# Patient Record
Sex: Male | Born: 1960 | Race: White | Hispanic: No | Smoking: Current every day smoker
Health system: Southern US, Community
[De-identification: ages and names within clinical notes are randomized; demographics above are authoritative.]

## PROBLEM LIST (undated history)

## (undated) DIAGNOSIS — F1010 Alcohol abuse, uncomplicated: Secondary | ICD-10-CM

## (undated) DIAGNOSIS — B182 Chronic viral hepatitis C: Secondary | ICD-10-CM

## (undated) DIAGNOSIS — I2510 Atherosclerotic heart disease of native coronary artery without angina pectoris: Secondary | ICD-10-CM

## (undated) DIAGNOSIS — J4489 Other specified chronic obstructive pulmonary disease: Secondary | ICD-10-CM

## (undated) DIAGNOSIS — Z91199 Patient's noncompliance with other medical treatment and regimen due to unspecified reason: Secondary | ICD-10-CM

## (undated) DIAGNOSIS — I498 Other specified cardiac arrhythmias: Secondary | ICD-10-CM

## (undated) DIAGNOSIS — Z9119 Patient's noncompliance with other medical treatment and regimen: Secondary | ICD-10-CM

## (undated) DIAGNOSIS — E785 Hyperlipidemia, unspecified: Secondary | ICD-10-CM

## (undated) DIAGNOSIS — M549 Dorsalgia, unspecified: Secondary | ICD-10-CM

## (undated) DIAGNOSIS — E119 Type 2 diabetes mellitus without complications: Secondary | ICD-10-CM

## (undated) DIAGNOSIS — M25559 Pain in unspecified hip: Secondary | ICD-10-CM

## (undated) DIAGNOSIS — E669 Obesity, unspecified: Secondary | ICD-10-CM

## (undated) DIAGNOSIS — R52 Pain, unspecified: Secondary | ICD-10-CM

## (undated) DIAGNOSIS — F1910 Other psychoactive substance abuse, uncomplicated: Secondary | ICD-10-CM

## (undated) DIAGNOSIS — I4892 Unspecified atrial flutter: Secondary | ICD-10-CM

## (undated) DIAGNOSIS — D45 Polycythemia vera: Secondary | ICD-10-CM

## (undated) DIAGNOSIS — J449 Chronic obstructive pulmonary disease, unspecified: Secondary | ICD-10-CM

## (undated) DIAGNOSIS — G8929 Other chronic pain: Secondary | ICD-10-CM

## (undated) DIAGNOSIS — M25569 Pain in unspecified knee: Secondary | ICD-10-CM

## (undated) DIAGNOSIS — F17200 Nicotine dependence, unspecified, uncomplicated: Secondary | ICD-10-CM

## (undated) HISTORY — DX: Polycythemia vera: D45

## (undated) HISTORY — DX: Patient's noncompliance with other medical treatment and regimen due to unspecified reason: Z91.199

## (undated) HISTORY — DX: Nicotine dependence, unspecified, uncomplicated: F17.200

## (undated) HISTORY — DX: Other specified cardiac arrhythmias: I49.8

## (undated) HISTORY — DX: Atherosclerotic heart disease of native coronary artery without angina pectoris: I25.10

## (undated) HISTORY — DX: Other specified chronic obstructive pulmonary disease: J44.89

## (undated) HISTORY — DX: Chronic viral hepatitis C: B18.2

## (undated) HISTORY — DX: Chronic obstructive pulmonary disease, unspecified: J44.9

## (undated) HISTORY — DX: Hyperlipidemia, unspecified: E78.5

## (undated) HISTORY — DX: Alcohol abuse, uncomplicated: F10.10

## (undated) HISTORY — DX: Obesity, unspecified: E66.9

## (undated) HISTORY — DX: Unspecified atrial flutter: I48.92

## (undated) HISTORY — DX: Patient's noncompliance with other medical treatment and regimen: Z91.19

## (undated) HISTORY — DX: Type 2 diabetes mellitus without complications: E11.9

---

## 2000-03-25 DIAGNOSIS — I2510 Atherosclerotic heart disease of native coronary artery without angina pectoris: Secondary | ICD-10-CM

## 2000-03-25 HISTORY — DX: Atherosclerotic heart disease of native coronary artery without angina pectoris: I25.10

## 2000-10-27 ENCOUNTER — Inpatient Hospital Stay (HOSPITAL_COMMUNITY): Admission: EM | Admit: 2000-10-27 | Discharge: 2000-10-28 | Payer: Self-pay | Admitting: Cardiology

## 2004-03-21 ENCOUNTER — Emergency Department (HOSPITAL_COMMUNITY): Admission: EM | Admit: 2004-03-21 | Discharge: 2004-03-21 | Payer: Self-pay | Admitting: *Deleted

## 2005-03-25 HISTORY — PX: OTHER SURGICAL HISTORY: SHX169

## 2005-06-21 ENCOUNTER — Ambulatory Visit (HOSPITAL_COMMUNITY): Admission: RE | Admit: 2005-06-21 | Discharge: 2005-06-21 | Payer: Self-pay | Admitting: Family Medicine

## 2005-06-21 ENCOUNTER — Emergency Department (HOSPITAL_COMMUNITY): Admission: EM | Admit: 2005-06-21 | Discharge: 2005-06-21 | Payer: Self-pay | Admitting: Family Medicine

## 2005-08-02 ENCOUNTER — Encounter: Admission: RE | Admit: 2005-08-02 | Discharge: 2005-08-02 | Payer: Self-pay | Admitting: Family Medicine

## 2005-09-30 ENCOUNTER — Encounter: Admission: RE | Admit: 2005-09-30 | Discharge: 2005-09-30 | Payer: Self-pay | Admitting: Occupational Medicine

## 2006-01-28 ENCOUNTER — Ambulatory Visit (HOSPITAL_BASED_OUTPATIENT_CLINIC_OR_DEPARTMENT_OTHER): Admission: RE | Admit: 2006-01-28 | Discharge: 2006-01-28 | Payer: Self-pay | Admitting: Specialist

## 2008-03-25 HISTORY — PX: TEE WITH CARDIOVERSION: SHX5442

## 2008-07-06 ENCOUNTER — Encounter: Payer: Self-pay | Admitting: Cardiology

## 2008-07-06 ENCOUNTER — Ambulatory Visit: Payer: Self-pay | Admitting: Cardiology

## 2008-07-07 ENCOUNTER — Encounter: Payer: Self-pay | Admitting: Cardiology

## 2008-07-08 ENCOUNTER — Encounter: Payer: Self-pay | Admitting: Cardiology

## 2008-07-12 ENCOUNTER — Ambulatory Visit: Payer: Self-pay | Admitting: Cardiology

## 2008-07-19 ENCOUNTER — Ambulatory Visit: Payer: Self-pay | Admitting: Cardiology

## 2008-08-24 ENCOUNTER — Ambulatory Visit: Payer: Self-pay | Admitting: Cardiology

## 2008-09-13 ENCOUNTER — Ambulatory Visit: Payer: Self-pay | Admitting: Cardiology

## 2008-10-12 ENCOUNTER — Encounter: Payer: Self-pay | Admitting: Cardiology

## 2008-11-07 ENCOUNTER — Encounter: Payer: Self-pay | Admitting: *Deleted

## 2008-12-12 DIAGNOSIS — I4892 Unspecified atrial flutter: Secondary | ICD-10-CM | POA: Insufficient documentation

## 2009-04-24 ENCOUNTER — Encounter: Payer: Self-pay | Admitting: Physician Assistant

## 2009-04-24 ENCOUNTER — Ambulatory Visit: Payer: Self-pay | Admitting: Cardiology

## 2009-04-24 DIAGNOSIS — F17200 Nicotine dependence, unspecified, uncomplicated: Secondary | ICD-10-CM

## 2009-04-24 DIAGNOSIS — E119 Type 2 diabetes mellitus without complications: Secondary | ICD-10-CM

## 2009-04-24 DIAGNOSIS — I10 Essential (primary) hypertension: Secondary | ICD-10-CM | POA: Insufficient documentation

## 2009-05-01 ENCOUNTER — Ambulatory Visit: Payer: Self-pay

## 2009-07-11 ENCOUNTER — Encounter: Payer: Self-pay | Admitting: Cardiology

## 2009-07-12 ENCOUNTER — Ambulatory Visit: Payer: Self-pay | Admitting: Cardiology

## 2009-07-24 ENCOUNTER — Encounter: Payer: Self-pay | Admitting: Cardiology

## 2010-04-15 ENCOUNTER — Encounter: Payer: Self-pay | Admitting: Surgery

## 2010-04-24 NOTE — Letter (Signed)
Summary: [facility] d/c Dr. Bea Laura  [facility] d/c Dr. Bea Laura   Imported By: Zachary George 07/24/2009 11:35:28  _____________________________________________________________________  External Attachment:    Type:   Image     Comment:   External Document

## 2010-04-24 NOTE — Letter (Signed)
Summary: Appointment -missed  [facility] at [facility] [address]   [place], [place] [address]   Phone: [phone]  Fax: [phone]     Jul 24, 2009 MRN: [id number]     [facility] [address] [place], [place]  [address]     Dear Mr. Buehrle,  Our records indicate you missed your appointment on Jul 24, 2009                        with Dr.  Andee Lineman.   It is very important that we reach you to reschedule this appointment. We look forward to participating in your health care needs.   Please contact us at the number listed above at your earliest convenience to reschedule this appointment.   Sincerely,    Glass blower/designer

## 2010-04-24 NOTE — Consult Note (Signed)
Summary: CARDIOLOGY CONSULT/ [facility]  CARDIOLOGY CONSULT/ [facility]   Imported By: Zachary George 07/24/2009 11:34:53  _____________________________________________________________________  External Attachment:    Type:   Image     Comment:   External Document

## 2010-04-24 NOTE — Assessment & Plan Note (Signed)
Summary: 6 MO FU PER DEC REMINDER-SRS   Visit Type:  Follow-up Primary Provider:  Dr.Fatdar   History of Present Illness: 50 year old male with history of recurrent atrial flutter, status post TEE-guided cardioversion 2010, normal LVF, and nonobstructive CAD, now presents for scheduled six-month followup.  As previously noted, patient is no longer on Coumadin, secondary to previous visual disturbances, which subsequently completely resolved. He is on aspirin.  Patient reports 2 singular episodes of tachypalpitations, the last approximately one month ago. He feels a little "tired", but denies any associated near-syncope/syncope. These episodes typically last "all day"; however, he states that the intensity/frequency is much improved, since undergoing successful cardioversion last year. EKG today indicates normal sinus rhythm.  Patient reports compliance with his medications, but did not bring them with him, and does not know what they are. Unfortunately, he continues to smoke.  Preventive Screening-Counseling & Management  Alcohol-Tobacco     Smoking Status: current     Smoking Cessation Counseling: yes     Packs/Day: 1&1/2 PPD  Current Medications (verified): 1)  Aspirin 325 Mg Tabs (Aspirin) .... Take 1 Tablet By Mouth Once A Day 2)  Metformin Hcl 500 Mg Tabs (Metformin Hcl) .... Take 1 Tablet By Mouth Two Times A Day 3)  Glipizide 10 Mg Xr24h-Tab (Glipizide) .... Take 1 Tablet By Mouth Once A Day 4)  Diltiazem Hcl Er Beads 120 Mg Xr24h-Cap (Diltiazem Hcl Er Beads) .... Take 1 Tablet By Mouth Once A Day 5)  Hydrocodone-Acetaminophen 7.5-500 Mg Tabs (Hydrocodone-Acetaminophen) .... As Needed 6)  Lisinopril 5 Mg Tabs (Lisinopril) .... Take 1 Tablet By Mouth Once A Day 7)  Spiriva Handihaler 18 Mcg Caps (Tiotropium Bromide Monohydrate) .... Inhale One Capsule Daily  Allergies (verified): No Known Drug Allergies  Comments:  Nurse/Medical Assistant: The patient is currently on  medications but does not know the name or dosage at this time. Instructed to contact our office with details. Will update medication list at that time.  Past History:  Past Medical History: Atrial flutter A) status post TEE-guided cardioversion in 2010 CAD, nonobstructive A) cardiac catheterization, August 2002 Normal LV function History of hypotension, resolved.  Polycythemia COPD/tobacco HTN Diabetes mellitus Alcohol abuse Hepatitis C Noncompliance  Social History: Packs/Day:  1&1/2 PPD  Review of Systems       No fevers, chills, hemoptysis, dysphagia, melena, hematocheezia, hematuria, rash, claudication, orthopnea, pnd, pedal edema. denies visual disturbances. All other systems negative.   Vital Signs:  Patient profile:   50 year old male Height:      76 inches Weight:      318 pounds BMI:     38.85 Pulse rate:   83 / minute BP sitting:   128 / 83  (left arm) Cuff size:   large  Vitals Entered By: Carlye Grippe (April 24, 2009 2:08 PM)  Nutrition Counseling: Patient's BMI is greater than 25 and therefore counseled on weight management options.   Physical Exam  Additional Exam:  GEN:50 year old male, sitting upright, in no distress HEENT: NCAT,PERRLA,EOMI NECK: palpable pulses, no bruits; no JVD; no TM LUNGS: diminished breath sounds, mild rhonchi HEART: RRR (S1S2); no significant murmurs; no rubs; no gallops ABD: soft, NT; intact BS EXT: trace pedal  edema SKIN: warm, dry MUSC: no obvious deformity NEURO: A/O (x3)     EKG  Procedure date:  04/24/2009  Findings:      NSR at 79 bpm; normal axis; no ischemic changes  Impression & Recommendations:  Problem # 1:  ATRIAL FLUTTER (  ICD-427.32)  patient reports 2 episodes of tachypalpitations, since his last office visit. Although these last "all day long", he suggests that the intensity/frequency is much improved, since undergoing DC cardioversion last year. As in the past, he continues to refuse  Coumadin anticoagulation, given his previous experience with visual disturbances. These have since resolved completely, and he is reporting compliance with aspirin. After contacting his pharmacy, we learn that he currently is not on any rate controlling agents. Metoprolol was previously listed. However, given his history of ongoing tobacco smoking, and possible asthmatic bronchitis, I've chosen to place him on Cardizem 120 mg daily for rate control. He will return in one week for a followup blood pressure/pulse check. If these are stable, he can continue on this dose, and we will revisit this when he returns to the clinic in 6 months, for followup with Dr. Andee Lineman.  Problem # 2:  ESSENTIAL HYPERTENSION, BENIGN (ICD-401.1)  stable on current medications.  Problem # 3:  DM (ICD-250.00) Assessment: Comment Only  Problem # 4:  TOBACCO ABUSE (ICD-305.1) Assessment: Comment Only  Other Orders: EKG w/ Interpretation (93000)  Patient Instructions: 1)  Start Diltiazem CD 120mg   2)  Increase Aspirin to 325mg  daily 3)  Nurse visit in one week to recheck blood pressure and heart rate. 4)  Follow up in  6 months.   Prescriptions: DILTIAZEM HCL ER BEADS 120 MG XR24H-CAP (DILTIAZEM HCL ER BEADS) Take 1 tablet by mouth once a day  #30 x 6   Entered by:   Hoover Brunette, LPN   Authorized by:   Lewayne Bunting, MD, [facility]   Signed by:   Hoover Brunette, LPN on 40/34/7425   Method used:   Electronically to        [facility] [address] (retail)       [address]       [place], [place]  [address]       Ph: [phone] or [phone]       Fax: [phone]   RxID:   [id number]

## 2010-04-24 NOTE — Assessment & Plan Note (Signed)
Summary: bp check  Nurse Visit   Vital Signs:  Patient profile:   50 year old male Height:      76 inches Weight:      315 pounds Pulse rate:   75 / minute BP sitting:   128 / 87  (left arm) Cuff size:   large  Vitals Entered By: Carlye Grippe (May 01, 2009 9:50 AM) CC: nurse BP Comments stable on current dose of Cardizem. . . will continue  gene Patient informed of the above.Carlye Grippe  May 02, 2009 9:49 AM   JY:[id number] HTN--yes HYQ:MVHQIO meds?--yes Side effects?--no Chest pain, SOB, Dizziness?--yes A/P: 1. HTN (401.1)             At goal?              If no, physician will be notified.              Follow up in ...Marland KitchenMarland KitchenMarland Kitchen  5 minutes was spent with the patient.      Preventive Screening-Counseling & Management  Alcohol-Tobacco     Smoking Status: current     Smoking Cessation Counseling: yes     Packs/Day: 1&1/2 PPD  Current Medications (verified): 1)  Aspirin 325 Mg Tabs (Aspirin) .... Take 1 Tablet By Mouth Once A Day 2)  Metformin Hcl 500 Mg Tabs (Metformin Hcl) .... Take 1 Tablet By Mouth Two Times A Day 3)  Glipizide 10 Mg Xr24h-Tab (Glipizide) .... Take 1 Tablet By Mouth Once A Day 4)  Diltiazem Hcl Er Beads 120 Mg Xr24h-Cap (Diltiazem Hcl Er Beads) .... Take 1 Tablet By Mouth Once A Day 5)  Hydrocodone-Acetaminophen 7.5-500 Mg Tabs (Hydrocodone-Acetaminophen) .... As Needed 6)  Lisinopril 5 Mg Tabs (Lisinopril) .... Take 1 Tablet By Mouth Once A Day 7)  Spiriva Handihaler 18 Mcg Caps (Tiotropium Bromide Monohydrate) .... Inhale One Capsule Daily  Allergies (verified): No Known Drug Allergies  Comments:  Nurse/Medical Assistant: The patient's medications and allergies were reviewed with the patient and were updated in the Medication and Allergy Lists. List reviewed.  Orders Added: 1)  Est. Patient Level I [96295]

## 2010-07-10 ENCOUNTER — Encounter: Payer: Self-pay | Admitting: Cardiology

## 2010-07-11 ENCOUNTER — Ambulatory Visit: Payer: Self-pay | Admitting: Cardiology

## 2010-08-07 NOTE — Assessment & Plan Note (Signed)
[facility]                          [facility] NOTE   NAME:BELCHERColtrane, Tugwell                     MRN:          [id number]  DATE:08/24/2008                            DOB:          May 07, 1960    HISTORY OF PRESENT ILLNESS:  The patient is a 50 year old male admitted  several months ago with atrial flutter and shortness of breath.  The  patient underwent T-guided cardioversion.  The patient's [place] score is 2  including risk factors of hypertension and diabetes mellitus.  However,  the patient has stopped taking his Coumadin as he states that he has had  blurred vision and bloody spots in his eyes.  He now only takes aspirin.  He is not willing to resume warfarin.  He is more than 4 weeks off his  cardioversion, however.   The patient also continues to smoke 1 carton a week.  He denies any  chest pain, but has significant shortness of breath on exertion and has  audible wheezing.   The patient denies any orthopnea, PND, palpitations, or syncope.   MEDICATIONS:  1. Metformin 500 mg p.o. b.i.d.  2. Glipizide 5 mg p.o. b.i.d.  3. Warfarin 5 mg as directed, but not taking.  4. Metoprolol tartrate 25 mg p.o. b.i.d.   PHYSICAL EXAMINATION:  VITAL SIGNS:  Blood pressure 139/82, heart rate  74, weight 301 pound.  GENERAL:  Obese, white male, but in no apparent distress.  HEENT:  Pupils, eyes clear; conjunctivae clear.  NECK:  Supple.  Normal carotid upstroke.  No carotid bruits.  LUNGS:  Bilateral wheezing with scattered rhonchi.  HEART:  Regular rate and rhythm.  Normal S1 and S2.  No pathological  murmurs.  ABDOMEN:  Soft, nontender.  No rebound or guarding.  Good bowel sounds.  EXTREMITIES:  No cyanosis, clubbing, or edema.  NEURO:  The patient is alert, oriented, and grossly nonfocal.   PROBLEM LIST:  1. Status post atrial flutter.      a.     Atrial flutter in 2002.      b.     Status post T-guided cardioversion.      c.     [place]  score of 2, but unable to take Coumadin.  2. Ejection fraction 60% with mild right ventricular dysfunction.  3. History of hypotension, resolved.  4. 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  5. Nonobstructive coronary artery disease in 2002.  6. History of alcohol use.  7. History of noncompliance.   PLAN:  1. The patient from a cardiac standpoint, I think is stable.  He has      had no recurrent palpitations or evidence of recurrent flutter.  He      will continue on his current medication including metoprolol.  2. The patient does have audible wheezing and has asthmatic bronchitis      and unfortunately continues to smoke.  I told him that he needs to      discuss with his primary care physician to give him an inhaler with      albuterol to provide temporary relief.  3.  The patient declines to continue warfarin given the side effects      mentioned above.  I told him that he is at increased risk for      stroke, but he wants to continue take aspirin.  4. The patient can follow up with [place] in 6 months.     Learta Codding, MD,FACC  Electronically Signed    GED/MedQ  DD: 08/24/2008  DT: 08/24/2008  Job #: [id number]

## 2010-08-10 NOTE — Cardiovascular Report (Signed)
[place]  Patient:    Derek Campbell, Derek Campbell                     MRN: [id number] Proc. Date: 10/28/00 Adm. Date:  [id number] Disc. Date: [id number] Attending:  Junious Silk CC:         Nona Dell, M.D.   Cardiac Catheterization  PROCEDURE: 1. Left heart catheterization 2. Left ventriculogram. 3. Selective coronary angiography. 4. Perclose right femoral artery.  DIAGNOSES: 1. Mild coronary artery disease by angiogram. 2. Normal left ventricular systolic function.  CARDIOLOGIST:  Veneda Melter, M.D.  HISTORY:  Derek Campbell is a 50 year old white male with multiple cardiac risk factors who presents with new onset of atrial fibrillation.  The patient has had several episodes of substernal chest discomfort now prompting admission to the hospital.  He subsequently ruled out for acute myocardial infarction and presents now for further cardiac assessment.  TECHNIQUE:  After informed consent was obtained, the patient was brought to the catheterization lab.  A 6-French sheath was placed in the left femoral artery.  Left heart catheterization and selective coronary angiography was then performed in the usual fashion using preformed 6-French Judkins catheters.  At the termination of the case, Perclose closure device was deployed to the right femoral artery until adequate hemostasis was achieved. The patient tolerated the procedure well and was transferred to the floor in stable condition.  FINDINGS: 1. Left main trunk:  Angiographically normal. 2. LAD.  This is a large caliber vessel that provides two small diagonal    branches.  The LAD does taper significantly as it approaches the apex.    There were mild irregularities of 10 to 20% in the proximal and mid    LAD. 3. Left circumflex artery: This begins as a large caliber vessel and provides    a large first marginal branch at the proximal segment, two smaller    marginal branches in the  mid section.  There are mild irregularities in the    left circumflex system. 4. Right coronary artery is dominant.  This is a large caliber vessel that    provides a posterior descending artery and and a bifurcating posterior    ventricular branch in its terminal segment.   The right coronary artery has    mild irregularities of 10 to 20% in the proximal mid section.  LEFT VENTRICULOGRAPHY:  Normal end systolic and end diastolic dimensions. Overall left ventricular function well preserved, ejection fraction greater than 55%.  No mitral regurgitation.  LV pressure 130/15, aortic 130/82.  LV EDP equals 25.  ASSESSMENT/PLAN:  Derek Campbell is a 50 year old gentleman who has mild coronary artery disease and normal left ventricular function by catheterization.  He has mildly elevated filling pressures, perhaps reflecting some diastolic dysfunction given his large body habitus.  In addition, other cause of chest pain should be considered.  Continued risk factor modification will be pursued for his coronary disease. DD:  10/28/00 TD:  10/28/00 Job: [id number]

## 2010-08-10 NOTE — Op Note (Signed)
NAME:  Derek Campbell, Derek Campbell            ACCOUNT NO.:  [id number]   MEDICAL RECORD NO.:  [id number]          PATIENT TYPE:  AMB   LOCATION:  [facility]                         FACILITY:  [facility]   PHYSICIAN:  Jene Every, M.D.    DATE OF BIRTH:  May 21, 1960   DATE OF PROCEDURE:  01/28/2006  DATE OF DISCHARGE:                                 OPERATIVE REPORT   PREOPERATIVE DIAGNOSIS:  Meniscus tear lateral right knee.   POSTOPERATIVE DIAGNOSIS:  Meniscus tear lateral right knee, grade 3  chondromalacia of the medial femoral condyle.   PROCEDURE PERFORMED:  Right knee arthroscopy, partial lateral meniscectomy,  chondroplasty of the medial femoral condyle.   ANESTHESIA:  General.   BRIEF HISTORY AND INDICATIONS:  This is a 50 year old status post a knee  injury with MRI indicating lateral meniscus tear.  Operative intervention  was indicated for partial resection and evaluation.  The risks and benefits  were discussed including bleeding, infection, damage to neurovascular  structures, no change in symptoms, worsening of symptoms, need for repeat  debridement, anesthetic complications, etc.   SURGICAL TECHNIQUE:  With the patient in the supine position, after  induction of adequate of general anesthesia and 2 gram Kefzol IV, the right  lower extremity was prepped and draped in the usual sterile fashion.  A  lateral parapatellar portal and superomedial parapatellar portal was  fashioned with a #11 blade.  Ingress cannula atraumatically placed.  Irrigant was utilized to insufflate the joint.  The arthroscopic camera was  then inserted laterally and under direct visualization, a medial  parapatellar portal was localized with an 18 gauge needle and fashioned with  a #11 blade sparing the medial meniscus.  Noted initially was a complex tear  of the posterior third of the lateral meniscus.  This was unstable.  Straight basket rongeurs were introduced and utilized to perform a partial  lateral  meniscectomy to a stable base.  This was further contoured with a  4.2 CUDA shaver and performing chondroplasties of the tibial plateau and the  femoral condyle.  The remnant was stable to palpation without residual  tearing.  The ACL and PCL were unremarkable.  The medial compartment  revealed some grade 3 changes of the femoral condyle that was debrided, as  well.  The medial meniscus was unremarkable without tear and stable to probe  palpation.  The gutters were unremarkable.  There was normal patellofemoral  tracking.  There were some mild grade 2 changes of the patella, otherwise,  unremarkable.  The knee was copiously lavaged.  I re-examined the lateral  compartment with stable remnant meniscus.  All instrumentation was removed.  The portals were closed with 4-0 nylon simple suture.  0.25% Marcaine with  epinephrine was infiltrated in the joint wound.  It was dressed sterilely.  He was awakened without difficulty and transported to the recovery room in  satisfactory condition.  The patient tolerated the procedure well without  complications.     Jene Every, M.D.  Electronically Signed    JB/MEDQ  D:  01/28/2006  T:  01/28/2006  Job:  [id number]

## 2010-08-10 NOTE — Discharge Summary (Signed)
[place]  Patient:    Derek Campbell, Derek Campbell                     MRN: [id number] Adm. Date:  [id number] Disc. Date: [id number] Attending:  Junious Silk Dictator:   Brita Romp, P.A. CC:         _______________   Discharge Summary  DISCHARGE DIAGNOSES: 1. Chest pain, mild coronary artery disease by catheterization. 2. Hypertension. 3. Non-insulin-dependent diabetes mellitus. 4. History of hepatitis C. 5. Tobacco abuse.  HOSPITAL COURSE:  Mr. Selbe is a 50 year old male who was seen initially on August 2nd by Dr. Jonelle Sidle.  At that time, he was noted to have new-onset atrial flutter with rapid ventricular response.  He spontaneously converted to normal sinus rhythm.  He also complained of substernal chest pain but ruled out for myocardial infarction.  His plan was then to get a stress Cardiolite exam for a diagnosis and risk stratification; however, due to the patients weight of 356 pounds, he was unable to get the study done during admission.  He was then scheduled for an outpatient exam using a dual-head camera.  On the day of admission, the patient presented to [facility] Emergency Room complaining of substernal chest pain, particularly with exertion; he also reported marked fatigue and exercise intolerance.  He denied any further palpitations.  He was seen at [facility] by Dr. Lewayne Bunting.  Dr. Andee Lineman ordered the patient transferred to [facility] for cardiac catheterization the following day.  The evening of admission, the patient underwent a CT scan of the head without contrast.  No intracranial abnormalities were noted.  The patient then underwent a CT scan of the chest with contrast.  There was no focal consolidation, pulmonary nodule or edema; there were also no pleural effusions.  There were no findings to support a pulmonary embolism; however, there were noted to be several non-pathologically enlarged  lymph nodes in the right hilum.  There was no other adenopathy noted.  The following day, the patient was taken to the cath lab by Dr. Veneda Melter. Catheterization results:  #1 - Left middle cerebral artery:  Mild disease. #2 - Left anterior descending artery:  Two diagonals, 10-20% stenosis in the proximal-to-mid vessel.  #3 - Left circumflex:  Large OM-1, small OM-2 and OM-3, mild disease.  #4 - Right coronary artery:  Dominant, PDA and PV present, 10-20% proximal-to-mid-vessel stenosis.  #5 - Left ventricle: Ejection fraction greater than 55%, no mitral regurgitation in list. Dr. Chales Abrahams recommended risk factor modification and felt the patient would be stable for discharge after the appropriate rest period.  DISCHARGE MEDICATIONS: 1. Glucophage 500 mg b.i.d., to be restarted on August 8th in the morning.    a. Due to the patients history of elevated renal function tests from his       hepatitis C, Dr. Diona Browner recommended consideration of changing the       patient to Glucotrol. 2. Enteric-coated aspirin 325 mg. 3. Metoprolol 25 mg or one-half 50 mg tablet b.i.d. 4. Ranitidine 150 mg b.i.d. 5. Nicotine patch 21 mg to be changed daily.  DISCHARGE INSTRUCTIONS:  Patient was advised to avoid driving, tub baths and sexual activity for two days.  DIET:  He is to follow a low-fat diet.  SPECIAL DISCHARGE INSTRUCTIONS:  He is to watch the cath site for any pain, bleeding or swelling and to call the  office for any of these problems.  He  was advised to stop smoking, particularly while using a nicotine patch.  After approximately six weeks, the patient should get a lower-strength patch from ______ .  FOLLOWUP:  He is to follow up with ______ in the office in approximately two weeks; he is to call for an appointment.  LABORATORY VALUES:  Cardiac enzymes negative for MI.  Total protein 7.0, albumin 3.6, AST 77, ALT 122, alkaline phosphatase 104, total bilirubin 0.8, direct  bilirubin 0.2, indirect bilirubin 0.6.  Hemoglobin A1c 9.6.  TSH 0.79, free T4 1.35.  Electrocardiogram revealed sinus rhythm at 60.  P-R interval 0.164, QRS 0.088, QTc 0.388, axis 59. DD:  10/28/00 TD:  10/30/00 Job: [id number]

## 2010-11-23 ENCOUNTER — Other Ambulatory Visit: Payer: Self-pay | Admitting: Cardiology

## 2011-03-26 DIAGNOSIS — F1910 Other psychoactive substance abuse, uncomplicated: Secondary | ICD-10-CM

## 2011-03-26 DIAGNOSIS — R52 Pain, unspecified: Secondary | ICD-10-CM

## 2011-03-26 HISTORY — DX: Pain, unspecified: R52

## 2011-03-26 HISTORY — DX: Other psychoactive substance abuse, uncomplicated: F19.10

## 2011-06-14 ENCOUNTER — Other Ambulatory Visit: Payer: Self-pay | Admitting: Interventional Cardiology

## 2011-06-14 ENCOUNTER — Encounter (HOSPITAL_COMMUNITY): Payer: Self-pay | Admitting: Pharmacy Technician

## 2011-06-19 ENCOUNTER — Encounter (HOSPITAL_COMMUNITY)
Admission: RE | Disposition: A | Discharge status: Home / Self Care | Payer: Self-pay | Source: Ambulatory Visit | Attending: Interventional Cardiology

## 2011-06-19 ENCOUNTER — Other Ambulatory Visit: Payer: Self-pay

## 2011-06-19 ENCOUNTER — Ambulatory Visit (HOSPITAL_COMMUNITY)
Admission: RE | Admit: 2011-06-19 | Discharge: 2011-06-19 | Disposition: A | Discharge status: Home / Self Care | Payer: Medicare Other | Source: Ambulatory Visit | Attending: Interventional Cardiology | Admitting: Interventional Cardiology

## 2011-06-19 DIAGNOSIS — Z8249 Family history of ischemic heart disease and other diseases of the circulatory system: Secondary | ICD-10-CM | POA: Insufficient documentation

## 2011-06-19 DIAGNOSIS — R9439 Abnormal result of other cardiovascular function study: Secondary | ICD-10-CM | POA: Insufficient documentation

## 2011-06-19 DIAGNOSIS — I2510 Atherosclerotic heart disease of native coronary artery without angina pectoris: Secondary | ICD-10-CM | POA: Insufficient documentation

## 2011-06-19 DIAGNOSIS — R079 Chest pain, unspecified: Secondary | ICD-10-CM | POA: Insufficient documentation

## 2011-06-19 DIAGNOSIS — J449 Chronic obstructive pulmonary disease, unspecified: Secondary | ICD-10-CM | POA: Insufficient documentation

## 2011-06-19 DIAGNOSIS — E119 Type 2 diabetes mellitus without complications: Secondary | ICD-10-CM | POA: Insufficient documentation

## 2011-06-19 DIAGNOSIS — J4489 Other specified chronic obstructive pulmonary disease: Secondary | ICD-10-CM | POA: Insufficient documentation

## 2011-06-19 DIAGNOSIS — I10 Essential (primary) hypertension: Secondary | ICD-10-CM | POA: Insufficient documentation

## 2011-06-19 DIAGNOSIS — F17200 Nicotine dependence, unspecified, uncomplicated: Secondary | ICD-10-CM | POA: Insufficient documentation

## 2011-06-19 HISTORY — PX: LEFT HEART CATHETERIZATION WITH CORONARY ANGIOGRAM: SHX5451

## 2011-06-19 HISTORY — PX: ABDOMINAL ANGIOGRAM: SHX5499

## 2011-06-19 SURGERY — LEFT HEART CATHETERIZATION WITH CORONARY ANGIOGRAM
Anesthesia: LOCAL

## 2011-06-19 MED ORDER — SODIUM CHLORIDE 0.9 % IV SOLN: 250.0000 mL | INTRAVENOUS | Status: DC | PRN

## 2011-06-19 MED ORDER — LIDOCAINE HCL (PF) 1 % IJ SOLN
INTRAMUSCULAR | Status: AC
  Filled 2011-06-19: qty 30

## 2011-06-19 MED ORDER — HEPARIN (PORCINE) IN NACL 2-0.9 UNIT/ML-% IJ SOLN
INTRAMUSCULAR | Status: AC
  Filled 2011-06-19: qty 2000

## 2011-06-19 MED ORDER — METFORMIN HCL 1000 MG PO TABS: 1000.0000 mg | ORAL_TABLET | Freq: Two times a day (BID) | ORAL | Status: AC

## 2011-06-19 MED ORDER — DIAZEPAM 5 MG PO TABS
5.0000 mg | ORAL_TABLET | ORAL | Status: AC
  Administered 2011-06-19: 5 mg via ORAL
  Filled 2011-06-19: qty 1

## 2011-06-19 MED ORDER — ASPIRIN 81 MG PO CHEW
324.0000 mg | CHEWABLE_TABLET | ORAL | Status: AC
  Administered 2011-06-19: 324 mg via ORAL
  Filled 2011-06-19: qty 4

## 2011-06-19 MED ORDER — MIDAZOLAM HCL 2 MG/2ML IJ SOLN
INTRAMUSCULAR | Status: AC
  Filled 2011-06-19: qty 2

## 2011-06-19 MED ORDER — FENTANYL CITRATE 0.05 MG/ML IJ SOLN
INTRAMUSCULAR | Status: AC
  Filled 2011-06-19: qty 2

## 2011-06-19 MED ORDER — SODIUM CHLORIDE 0.9 % IV SOLN: 1.0000 mL/kg/h | INTRAVENOUS | Status: DC

## 2011-06-19 MED ORDER — NITROGLYCERIN 0.2 MG/ML ON CALL CATH LAB
INTRAVENOUS | Status: AC
  Filled 2011-06-19: qty 1

## 2011-06-19 MED ORDER — SODIUM CHLORIDE 0.9 % IJ SOLN: 3.0000 mL | Freq: Two times a day (BID) | INTRAMUSCULAR | Status: DC

## 2011-06-19 MED ORDER — SODIUM CHLORIDE 0.9 % IV SOLN
INTRAVENOUS | Status: DC
  Administered 2011-06-19: 08:00:00 via INTRAVENOUS

## 2011-06-19 MED ORDER — HEPARIN SODIUM (PORCINE) 1000 UNIT/ML IJ SOLN
INTRAMUSCULAR | Status: AC
  Filled 2011-06-19: qty 1

## 2011-06-19 MED ORDER — SODIUM CHLORIDE 0.9 % IJ SOLN: 3.0000 mL | INTRAMUSCULAR | Status: DC | PRN

## 2011-06-19 NOTE — H&P (Signed)
Date of Initial H&P: 06/13/11  History reviewed, patient examined, no change in status, stable for surgery.

## 2011-06-19 NOTE — CV Procedure (Signed)
PROCEDURE:  Left heart catheterization with selective coronary angiography, left ventriculogram, abdominal aortogram.  INDICATIONS:    The risks, benefits, and details of the procedure were explained to the patient.  The patient verbalized understanding and wanted to proceed.  Informed written consent was obtained.  PROCEDURE TECHNIQUE:  After Xylocaine anesthesia a 8F sheath was placed in the right radial artery with a single anterior needle wall stick.   Left coronary angiography was done using a Judkins L4 guide catheter.  Right coronary angiography was done using a Judkins R4 guide catheter.  Left ventriculography was done using a pigtail catheter.   The pigtail catheter was used to perform abdominal aortogram.   CONTRAST:  Total of 90 cc.  COMPLICATIONS:  None.    HEMODYNAMICS:  Aortic pressure was 119/81; LV pressure was 125/8; LVEDP 19.  There was no gradient between the left ventricle and aorta.    ANGIOGRAPHIC DATA:   The left main coronary artery is widely patent.  The left anterior descending artery is a large vessel which wraps around the apex.  There is mild atherosclerosis in the mid portion of the vessel.  There are 2 medium-sized diagonals which are widely patent.  The left circumflex artery is a large dominant vessel.  There is a large first obtuse marginal which has only mild irregularities.  This extends across the lateral wall.  The second obtuse marginal is medium size vessel which is widely patent..  The right coronary artery is a large dominant vessel.  There is mild atherosclerosis in the mid vessel.  The posterior descending artery and posterolateral artery are large vessels which appear widely patent.  There is mild atherosclerosis in the PDA.  LEFT VENTRICULOGRAM:  Left ventricular angiogram was done in the 30 RAO projection and revealed normal left ventricular wall motion and systolic function with an estimated ejection fraction of 55 %.  LVEDP was 19  mmHg.  ABDOMINAL AORTOGRAM:  No abdominal aortic aneurysm.  Patent aortoiliac bifurcation.  IMPRESSIONS:  1. Normal left main coronary artery. 2. Mild, scattered coronary atherosclerosis in the LAD and RCA without significant disease. 3. Normal left ventricular systolic function.  LVEDP 19 mmHg.  Ejection fraction 55 %. 4.  No abdominal aortic aneurysm.  RECOMMENDATION:  Continue medical therapy and risk factor modification.  He needs to stop smoking.  He should use low-dose aspirin.

## 2011-06-19 NOTE — Discharge Instructions (Addendum)
No lifting more than 10 pounds for the next 3 days.  Avoid bending the right  Wrist for 48 hours.  Follow post radial cath instructions.   Radial Site Care Refer to this sheet in the next few weeks. These instructions provide you with information on caring for yourself after your procedure. Your caregiver may also give you more specific instructions. Your treatment has been planned according to current medical practices, but problems sometimes occur. Call your caregiver if you have any problems or questions after your procedure. HOME CARE INSTRUCTIONS  You may shower the day after the procedure.Remove the bandage (dressing) and gently wash the site with plain soap and water.Gently pat the site dry.   Do not apply powder or lotion to the site.   Do not submerge the affected site in water for 3 to 5 days.   Inspect the site at least twice daily.   Do not flex or bend the affected arm for 48 hours.   Do not drive home if you are discharged the same day of the procedure. Have someone else drive you.   You may drive 24 hours after the procedure unless otherwise instructed by your caregiver.   Do not operate machinery or power tools for 24 hours.   A responsible adult should be with you for the first 24 hours after you arrive home.  What to expect:  Any bruising will usually fade within 1 to 2 weeks.   Blood that collects in the tissue (hematoma) may be painful to the touch. It should usually decrease in size and tenderness within 1 to 2 weeks.  SEEK IMMEDIATE MEDICAL CARE IF:  You have unusual pain at the radial site.   You have redness, warmth, swelling, or pain at the radial site.   You have drainage (other than a small amount of blood on the dressing).   You have chills.   You have a fever or persistent symptoms for more than 72 hours.   You have a fever and your symptoms suddenly get worse.   Your arm becomes pale, cool, tingly, or numb.   You have heavy bleeding from  the site. Hold pressure on the site.  Document Released: 04/13/2010 Document Revised: 02/28/2011 Document Reviewed: 04/13/2010 [facility] Patient Information 2012 [place], [place].

## 2011-06-20 MED FILL — Nicardipine HCl IV Soln 2.5 MG/ML: INTRAVENOUS | Qty: 1 | Status: AC

## 2011-08-13 ENCOUNTER — Ambulatory Visit (INDEPENDENT_AMBULATORY_CARE_PROVIDER_SITE_OTHER): Payer: Medicare Other | Admitting: Psychology

## 2011-08-13 ENCOUNTER — Encounter (HOSPITAL_COMMUNITY): Payer: Self-pay | Admitting: Psychology

## 2011-08-13 DIAGNOSIS — F419 Anxiety disorder, unspecified: Secondary | ICD-10-CM

## 2011-08-13 DIAGNOSIS — F411 Generalized anxiety disorder: Secondary | ICD-10-CM

## 2011-08-13 NOTE — Progress Notes (Signed)
Patient:   Derek Campbell   DOB:   1960-10-12  MR Number:  [id number]  Location:  [facility]AV[facility] [address] [place] [address] Dept: [phone]           Date of Service:   08/13/2011  Start Time:   2 PM End Time:   2:45 PM  Provider/Observer:  Hershal Coria PSYD       Billing Code/Service: [phone]  Chief Complaint:     Chief Complaint  Patient presents with  . Anxiety  . Agitation  . Stress    Reason for Service:  The patient was referred by Dr. Gerilyn Pilgrim because of what the patient describes as significant anxiety and stress. The patient describes his major stressors right now have to do with taking care of his mother and father on a continual basis and dealing with financial situations. The patient reports that there are numerous things that he has to do with his elderly parents which is extremely difficult for him. The patient reports he is always worried about his mother and father initiate that they're getting into with regard to their cognitive abilities. He reports a distress his been very prominent over the past 1-2 years. He reports that this is a major factor in his life and he does not want to do things her go places that he use to and now he simply states at home. The patient describes moderate to significant symptoms of mood changes, appetite disturbance, sleep disturbance, racing thoughts, excessive worrying, low energy, relationship distress, panic attacks, obsessive thoughts, changes in sex drive, and poor concentration. The patient reports he was seen at [facility] mental health Department 3 years ago.  Conjunction with this, the patient has significant pain and orthopedic difficulties with his back, hip, and right knee difficulties. He has been followed in [place] [facility] for pain management. However, during his most recent visit he had a drug  screen for full and he tested positive for alprazolam and it metabolites. He isn't knowledge this to the practice stating that he takes one every now and then from a friend do to "nerves" due to taking care of his elderly parents. This was of great concern because of his polysubstance drug use in the past and the fact that he is under a pain management contract with their facility to not do things like this. He was referred here due to these issues.  Current Status:  The patient comes in today and reports that he has a tremendous amount of stress. However, he immediately went to explain what brought him here and acknowledges the fact that he tested positive for Xanax and if they referred him to our office so he have the prescription for this medicine. I explained to him that that was not how things proceed and that we initially needed to look at all kinds of issues before prescriptions for medications like Xanax were done. We began a discussion about some of the long-term problems with benzodiazepine and and he went on to state that he can get these whenever he needed to have he just wanted to but was going to follow the instructions of his physician. However, when it was explained to him that we would not be giving him a prescription for Xanax today he essentially said well if I'm not to get a prescription today and I have nothing else to really safety. At that point, he politely  left the office with no further discussion.  Reliability of Information: Information was provided by both the patient himself is well as a review of medical records including a recent office visit note from his neurologist.  Behavioral Observation: Derek Campbell  presents as a 51 y.o.-year-old Right Caucasian Male who appeared his stated age. his dress was Appropriate and he was Casual and his manners were Appropriate to the situation.  There were not any physical disabilities noted.  he displayed an inappropriate level of  cooperation and motivation.    Interactions:    Active   Attention:   within normal limits  Memory:   within normal limits  Visuo-spatial:   within normal limits  Speech (Volume):  normal  Speech:   normal pitch  Thought Process:  Coherent  Though Content:  WNL  Orientation:   person, place, time/date and situation  Judgment:   Good  Planning:   Good  Affect:    Irritable  Mood:    Angry  Insight:   Fair  Intelligence:   normal  Current Employment: The patient is not currently employed and is disabled because of his physical limitations.  Substance Use:  There is a documented history of prescription drug abuse confirmed by His pain management clinic for the use of Xanax that was not prescribed to him..  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 He does acknowledge periodic use of Xanax from a friend when he is under a great deal of stress or has difficulty falling asleep at night. He is also under the care of pain management clinic for orthopedic pain including back, hip, and knee pain.  Education:   GED  Medical History:   Past Medical History  Diagnosis Date  . Coronary atherosclerosis of native coronary artery 2002    CATHETERIZATION   . Atrial flutter     status post TEE-guided cardioversion 2010  . Type II or unspecified type diabetes mellitus without mention of complication, not stated as uncontrolled   . Other and unspecified hyperlipidemia   . Chronic airway obstruction, not elsewhere classified   . Personal history of noncompliance with medical treatment, presenting hazards to health   . Tobacco use disorder   . Obesity, unspecified   . Other specified cardiac dysrhythmias   . Chronic hepatitis C without mention of hepatic coma   . Alcohol abuse   . Polycythemia vera          Outpatient Encounter Prescriptions as of 08/13/2011  Medication Sig Dispense Refill  . oxyCODONE-acetaminophen (PERCOCET) 7.5-325 MG per tablet Take 1 tablet by mouth every 4 (four) hours as needed.      . Cholecalciferol (VITAMIN D) 1000 UNITS capsule Take 1,000 Units by mouth daily.      . DULoxetine (CYMBALTA) 60 MG capsule Take 60 mg by mouth daily.      Marland Kitchen HYDROcodone-acetaminophen (LORTAB) 7.5-500 MG per tablet Take 1 tablet by mouth every 6 (six) hours as needed. For pain      . hydrOXYzine (ATARAX/VISTARIL) 10 MG tablet Take 10 mg by mouth 4 (four) times daily.      . insulin detemir (LEVEMIR) 100 UNIT/ML injection Inject 35 Units into the skin at bedtime.      Marland Kitchen lisinopril (PRINIVIL,ZESTRIL) 5 MG tablet Take 5 mg by mouth daily.        Marland Kitchen  metFORMIN (GLUCOPHAGE) 1000 MG tablet Take 1 tablet (1,000 mg total) by mouth 2 (two) times daily with a meal.              Sexual History:   History  Sexual Activity  . Sexually Active: Not on file    Abuse/Trauma History: There is no report of any types of physical or emotional abuse in his past.  Psychiatric History:  The patient was seen through [facility] mental health department 3 years ago but has not gone into any detail regarding this.  Family Med/Psych History: No family history on file.  Risk of Suicide/Violence: low   Impression/DX:  At this point, the patient does acknowledge a significant amount of anxiety and stress imaging from his living with his parents and taking care of them as they begin to experience failing mental functioning. He essentially is the sole caretaker for both of his parents and this is extremely stressful to him. He does have a history of some type of psychiatric treatment in the past but we were not able to get into that to any degree. The patient acknowledges severe orthopedic pain in which she is being followed by pain management clinic. However, he openly knowledge is periodic use of the Xanax for  stress and at to help with sleep when he is under a lot of stress but reports that this is very infrequent.  The patient has tested positive for Xanax through his drug screening conducted to the pain management clinic. The patient also reports that he could simply find another doctor to write these prescriptions if we were unwilling to and I explained to him that this was not a process that was taking likely by office and we would need to look at all types of variables including whether this is the most appropriate and helpful strategy for him based on medical information. The patient simply left at that point stating that if we were not going to write him a prescription for Xanax today there was really no other need for him to be talking with me.  Disposition/Plan:  At this point, there is no scheduled followup appointment or any other type of medical information or followup as the patient chose to leave the office because we were not going to write him a prescription for Xanax today.  Diagnosis:    Axis I:   1. Anxiety         Axis II: Deferred       Axis III:  Severe orthopedic pain with his back, hip, and knee. The patient also has a history of diabetes that is being treated with metformin, high cholesterol, heart palpitations, shortness of breath and likely COPD, and heart arrhythmia/heart murmur.      Axis IV:  housing problems, other psychosocial or environmental problems and problems with primary support group          Axis V:  51-60 moderate symptoms

## 2011-10-03 ENCOUNTER — Ambulatory Visit: Payer: Self-pay | Admitting: Gastroenterology

## 2012-10-02 ENCOUNTER — Other Ambulatory Visit: Payer: Self-pay | Admitting: Medical

## 2013-12-15 ENCOUNTER — Encounter: Payer: Medicare HMO | Attending: "Endocrinology | Admitting: Nutrition

## 2013-12-15 VITALS — Ht 76.0 in | Wt 246.0 lb

## 2013-12-15 DIAGNOSIS — Z713 Dietary counseling and surveillance: Secondary | ICD-10-CM | POA: Insufficient documentation

## 2013-12-15 DIAGNOSIS — J449 Chronic obstructive pulmonary disease, unspecified: Secondary | ICD-10-CM | POA: Insufficient documentation

## 2013-12-15 DIAGNOSIS — E119 Type 2 diabetes mellitus without complications: Secondary | ICD-10-CM | POA: Insufficient documentation

## 2013-12-15 DIAGNOSIS — R0602 Shortness of breath: Secondary | ICD-10-CM | POA: Insufficient documentation

## 2013-12-15 DIAGNOSIS — J4489 Other specified chronic obstructive pulmonary disease: Secondary | ICD-10-CM | POA: Insufficient documentation

## 2013-12-15 NOTE — Patient Instructions (Signed)
Plan:  Aim for 3-4 Carb Choices per meal (45-60 grams) +/- 1 either way  Aim for 0-1 Carbs per snack if hungry  Include protein in moderation with your meals and snack Choose healthier snacks if needed between meals. Consider  increasing your activity level by 15 minutes daily as tolerated Checking BG at desigated times per day as directed by MD  Take medication as prescribed by MD- 70/30 Novolin 30 units twice a day.  Avoid eating or drinking concentrated sweets due to diabetes and COPD. Increase water intake to 64 oz per day and reduce amount of Diet MT Dew consumed daily.

## 2013-12-15 NOTE — Progress Notes (Signed)
  Medical Nutrition Therapy:  Appt start time: 1430 end time:  5643.   Assessment:  Primary concerns today: Diabetes. He states he can't afford the Levemir and so isn't taking it. Having difficulty breathing with history of COPD. Smokes and not interested in quitting. Concerned about wanting to eat the foods he likes with [employer]. Not able to exercise much due to breathing issues. Complains of increased thirst, tired, urination frequently and being hungry all the time. Most recent A1C > 14%. States he is in a lot of pain. Willing to consider changes to his diet to improve his diabetes and his breathing. Does his own shopping and cooking. Mostly fries his food.  Preferred Learning Style:     No preference indicated   Learning Readiness:   Ready  MEDICATIONS: see list   DIETARY INTAKE:  24-hr recall:  B ( AM): Likes to eat 3-4 eggs with some toast at  breakfast  Snk ( AM): sometimes junk food L ( PM): meat, vegetables, Dt. soda Snk ( PM): misc. sweets D ( PM): meat, vegetables, most southern foods. Does like some vegetables. Snk ( PM): whatever he may want to eat at the time, sweets, junk food Beverages: Diet sodas, water  Usual physical activity: not much due to breathing issues.  Estimated energy needs: 2000 calories 225 g carbohydrates 150 g protein 56 g fat  Progress Towards Goal(s):  In progress.   Nutritional Diagnosis:  NB-1.1 Food and nutrition-related knowledge deficit As related to not meeting with a dietitian for education before.  As evidenced by inaccurate information and A1C >13%.    Intervention:  Nutrition and diabetes education on diet, meal planning, portion sizes, My Plate, carb counting and healthy snacks Plan:  Aim for 3-4 Carb Choices per meal (45-60 grams) +/- 1 either way  Aim for 0-1 Carbs per snack if hungry  Include protein in moderation with your meals and snack Choose healthier snacks if needed between meals. Consider  increasing your  activity level by 15 minutes daily as tolerated Checking BG at desigated times per day as directed by MD  Take medication as prescribed by MD- 70/30 Novolin 30 units twice a day.  Avoid eating or drinking concentrated sweets due to diabetes and COPD. Increase water intake to 64 oz per day and reduce amount of Diet MT Dew consumed daily.  Goal: To get A1C down to less than 9% in three months.  Teaching Method Utilized:  Visual Auditory Hands on  Handouts given during visit include:      My Plate Carb Counting  Meal Plan Card  Barriers to learning/adherence to lifestyle change: finances  Demonstrated degree of understanding via:  Teach Back   Monitoring/Evaluation:  Dietary intake, exercise, meal planning, SBG, medication compliance, and body weight in 1 month(s).

## 2014-03-03 ENCOUNTER — Encounter (HOSPITAL_COMMUNITY): Payer: Self-pay | Admitting: Interventional Cardiology

## 2014-10-28 ENCOUNTER — Observation Stay (HOSPITAL_COMMUNITY)
Admission: EM | Admit: 2014-10-28 | Discharge: 2014-10-30 | Disposition: A | Discharge status: Home / Self Care | Payer: Medicare HMO | Attending: Family Medicine | Admitting: Family Medicine

## 2014-10-28 ENCOUNTER — Emergency Department (HOSPITAL_COMMUNITY): Payer: Medicare HMO

## 2014-10-28 ENCOUNTER — Encounter (HOSPITAL_COMMUNITY): Payer: Self-pay | Admitting: Emergency Medicine

## 2014-10-28 DIAGNOSIS — R000 Tachycardia, unspecified: Secondary | ICD-10-CM | POA: Diagnosis present

## 2014-10-28 DIAGNOSIS — Z8659 Personal history of other mental and behavioral disorders: Secondary | ICD-10-CM | POA: Diagnosis not present

## 2014-10-28 DIAGNOSIS — R232 Flushing: Secondary | ICD-10-CM | POA: Diagnosis present

## 2014-10-28 DIAGNOSIS — G8929 Other chronic pain: Secondary | ICD-10-CM | POA: Diagnosis present

## 2014-10-28 DIAGNOSIS — Z87891 Personal history of nicotine dependence: Secondary | ICD-10-CM | POA: Diagnosis not present

## 2014-10-28 DIAGNOSIS — E119 Type 2 diabetes mellitus without complications: Secondary | ICD-10-CM | POA: Insufficient documentation

## 2014-10-28 DIAGNOSIS — R220 Localized swelling, mass and lump, head: Secondary | ICD-10-CM | POA: Diagnosis present

## 2014-10-28 DIAGNOSIS — I951 Orthostatic hypotension: Secondary | ICD-10-CM | POA: Diagnosis present

## 2014-10-28 DIAGNOSIS — B182 Chronic viral hepatitis C: Secondary | ICD-10-CM | POA: Diagnosis not present

## 2014-10-28 DIAGNOSIS — F17200 Nicotine dependence, unspecified, uncomplicated: Secondary | ICD-10-CM | POA: Diagnosis present

## 2014-10-28 DIAGNOSIS — I10 Essential (primary) hypertension: Secondary | ICD-10-CM | POA: Diagnosis present

## 2014-10-28 DIAGNOSIS — R0600 Dyspnea, unspecified: Secondary | ICD-10-CM | POA: Diagnosis present

## 2014-10-28 DIAGNOSIS — R21 Rash and other nonspecific skin eruption: Secondary | ICD-10-CM | POA: Insufficient documentation

## 2014-10-28 DIAGNOSIS — R51 Headache: Secondary | ICD-10-CM | POA: Insufficient documentation

## 2014-10-28 DIAGNOSIS — E118 Type 2 diabetes mellitus with unspecified complications: Secondary | ICD-10-CM

## 2014-10-28 DIAGNOSIS — Z720 Tobacco use: Secondary | ICD-10-CM

## 2014-10-28 HISTORY — DX: Other chronic pain: G89.29

## 2014-10-28 HISTORY — DX: Patient's noncompliance with other medical treatment and regimen due to unspecified reason: Z91.199

## 2014-10-28 HISTORY — DX: Pain in unspecified hip: M25.559

## 2014-10-28 HISTORY — DX: Pain, unspecified: R52

## 2014-10-28 HISTORY — DX: Pain in unspecified knee: M25.569

## 2014-10-28 HISTORY — DX: Other psychoactive substance abuse, uncomplicated: F19.10

## 2014-10-28 HISTORY — DX: Dorsalgia, unspecified: M54.9

## 2014-10-28 HISTORY — DX: Patient's noncompliance with other medical treatment and regimen: Z91.19

## 2014-10-28 LAB — URINALYSIS, ROUTINE W REFLEX MICROSCOPIC
GLUCOSE, UA: NEGATIVE mg/dL
LEUKOCYTES UA: NEGATIVE
Nitrite: NEGATIVE
Specific Gravity, Urine: >1.03 — ABNORMAL HIGH (ref 1.005–1.030)
Urobilinogen, UA: 4 mg/dL — ABNORMAL HIGH (ref 0.0–1.0)
pH: 5.5 (ref 5.0–8.0)

## 2014-10-28 LAB — CBC WITH DIFFERENTIAL/PLATELET
BASOS ABS: 0 10*3/uL (ref 0.0–0.1)
BASOS PCT: 0 % (ref 0–1)
Eosinophils Absolute: 0 10*3/uL (ref 0.0–0.7)
Eosinophils Relative: 0 % (ref 0–5)
HCT: 49.4 % (ref 39.0–52.0)
HEMOGLOBIN: 16.8 g/dL (ref 13.0–17.0)
LYMPHS ABS: 1.8 10*3/uL (ref 0.7–4.0)
Lymphocytes Relative: 13 % (ref 12–46)
MCH: 27.9 pg (ref 26.0–34.0)
MCHC: 34 g/dL (ref 30.0–36.0)
MCV: 82.1 fL (ref 78.0–100.0)
Monocytes Absolute: 1.7 10*3/uL — ABNORMAL HIGH (ref 0.1–1.0)
Monocytes Relative: 12 % (ref 3–12)
NEUTROS ABS: 10.6 10*3/uL — AB (ref 1.7–7.7)
NEUTROS PCT: 75 % (ref 43–77)
Platelets: 125 10*3/uL — ABNORMAL LOW (ref 150–400)
RBC: 6.02 MIL/uL — AB (ref 4.22–5.81)
RDW: 13.7 % (ref 11.5–15.5)
WBC: 14.1 10*3/uL — AB (ref 4.0–10.5)

## 2014-10-28 LAB — COMPREHENSIVE METABOLIC PANEL
ALBUMIN: 3.2 g/dL — AB (ref 3.5–5.0)
ALT: 28 U/L (ref 17–63)
AST: 22 U/L (ref 15–41)
Alkaline Phosphatase: 76 U/L (ref 38–126)
Anion gap: 11 (ref 5–15)
BILIRUBIN TOTAL: 0.6 mg/dL (ref 0.3–1.2)
BUN: 21 mg/dL — ABNORMAL HIGH (ref 6–20)
CALCIUM: 8.7 mg/dL — AB (ref 8.9–10.3)
CO2: 29 mmol/L (ref 22–32)
CREATININE: 0.91 mg/dL (ref 0.61–1.24)
Chloride: 92 mmol/L — ABNORMAL LOW (ref 101–111)
GFR calc Af Amer: >60 mL/min (ref >60)
GFR calc non Af Amer: >60 mL/min (ref >60)
Glucose, Bld: 224 mg/dL — ABNORMAL HIGH (ref 65–99)
Potassium: 5 mmol/L (ref 3.5–5.1)
Sodium: 132 mmol/L — ABNORMAL LOW (ref 135–145)
Total Protein: 6.6 g/dL (ref 6.5–8.1)

## 2014-10-28 LAB — RAPID URINE DRUG SCREEN, HOSP PERFORMED
Amphetamines: NOT DETECTED
BARBITURATES: NOT DETECTED
Benzodiazepines: NOT DETECTED
COCAINE: NOT DETECTED
Opiates: POSITIVE — AB
Tetrahydrocannabinol: NOT DETECTED

## 2014-10-28 LAB — AMMONIA: AMMONIA: 19 umol/L (ref 9–35)

## 2014-10-28 LAB — PROTIME-INR
INR: 1.11 (ref 0.00–1.49)
PROTHROMBIN TIME: 14.5 s (ref 11.6–15.2)

## 2014-10-28 LAB — URINE MICROSCOPIC-ADD ON

## 2014-10-28 LAB — ETHANOL: Alcohol, Ethyl (B): <5 mg/dL (ref <5)

## 2014-10-28 LAB — CBG MONITORING, ED: GLUCOSE-CAPILLARY: 237 mg/dL — AB (ref 65–99)

## 2014-10-28 LAB — LACTIC ACID, PLASMA
LACTIC ACID, VENOUS: 1.6 mmol/L (ref 0.5–2.0)
LACTIC ACID, VENOUS: 1.3 mmol/L (ref 0.5–2.0)

## 2014-10-28 LAB — TROPONIN I

## 2014-10-28 MED ORDER — SODIUM CHLORIDE 0.9 % IV BOLUS (SEPSIS)
250.0000 mL | Freq: Once | INTRAVENOUS | Status: AC
  Administered 2014-10-28: 250 mL via INTRAVENOUS

## 2014-10-28 MED ORDER — MORPHINE SULFATE 2 MG/ML IJ SOLN
2.0000 mg | INTRAMUSCULAR | Status: DC | PRN
  Administered 2014-10-28: 2 mg via INTRAVENOUS
  Filled 2014-10-28: qty 1

## 2014-10-28 MED ORDER — METHYLPREDNISOLONE SODIUM SUCC 125 MG IJ SOLR
125.0000 mg | Freq: Once | INTRAMUSCULAR | Status: AC
  Administered 2014-10-28: 125 mg via INTRAVENOUS
  Filled 2014-10-28: qty 2

## 2014-10-28 MED ORDER — DIPHENHYDRAMINE HCL 50 MG/ML IJ SOLN
25.0000 mg | Freq: Once | INTRAMUSCULAR | Status: AC
  Administered 2014-10-28: 25 mg via INTRAVENOUS
  Filled 2014-10-28: qty 1

## 2014-10-28 MED ORDER — IOHEXOL 300 MG/ML  SOLN
75.0000 mL | Freq: Once | INTRAMUSCULAR | Status: AC | PRN
  Administered 2014-10-28: 75 mL via INTRAVENOUS

## 2014-10-28 MED ORDER — SODIUM CHLORIDE 0.9 % IV SOLN
INTRAVENOUS | Status: DC
  Administered 2014-10-28: 20:00:00 via INTRAVENOUS

## 2014-10-28 NOTE — ED Provider Notes (Signed)
CSN: [id number]     Arrival date & time 10/28/14  1908 History   First MD Initiated Contact with Patient 10/28/14 1917     Chief Complaint  Patient presents with  . Headache  . Fatigue  . Cough      HPI  Pt was seen at 1915. Per pt, c/o gradual onset and persistence of constant "head pain" for the past 3 days. States his "entire head aches." Has been associated with head, neck and upper body non-pruritic "rash," generalized fatigue, and facial "swelling." Pt also c/o "cough" for "a while." Pt has long hx of ongoing heavy tobacco use. Denies fevers, no CP/palpitations, no SOB, no abd pain, no N/V/D, no back pain, no neck pain, no fevers, no injury, no visual changes, no focal motor weakness, no tingling/numbness in extremities. Denies headache was sudden or maximal in onset or at any time.     Past Medical History  Diagnosis Date  . Coronary atherosclerosis of native coronary artery 2002    CATHETERIZATION   . Atrial flutter     status post TEE-guided cardioversion 2010  . Type II or unspecified type diabetes mellitus without mention of complication, not stated as uncontrolled   . Other and unspecified hyperlipidemia   . Chronic airway obstruction, not elsewhere classified   . Personal history of noncompliance with medical treatment, presenting hazards to health   . Tobacco use disorder   . Obesity, unspecified   . Other specified cardiac dysrhythmias(427.89)   . Chronic hepatitis C without mention of hepatic coma   . Alcohol abuse   . Polycythemia vera(238.4)   . Noncompliance   . Polysubstance abuse 2013  . Chronic back pain   . Chronic hip pain   . Chronic knee pain   . Pain management 2013   Past Surgical History  Procedure Laterality Date  . Right knee surgery  2007     Right knee arthroscopy, partial lateral meniscectomy,  . Left heart catheterization with coronary angiogram N/A 06/19/2011    Procedure: LEFT HEART CATHETERIZATION WITH CORONARY ANGIOGRAM;  Surgeon:  Jettie Booze, MD;  Location: [facility] CATH LAB;  Service: Cardiovascular;  Laterality: N/A;  . Abdominal angiogram N/A 06/19/2011    Procedure: ABDOMINAL ANGIOGRAM;  Surgeon: Jettie Booze, MD;  Location: [facility] CATH LAB;  Service: Cardiovascular;  Laterality: N/A;  . Tee with cardioversion  2010    for atrial flutter    History  Substance Use Topics  . Smoking status: Current Every Day Smoker -- 1.30 packs/day    Types: Cigarettes  . Smokeless tobacco: Not on file  . Alcohol Use: No    Review of Systems ROS: Statement: All systems negative except as marked or noted in the HPI; Constitutional: Negative for fever and chills. ; ; Eyes: Negative for eye pain, redness and discharge. ; ; ENMT: +facial swelling. Negative for ear pain, hoarseness, nasal congestion, sinus pressure and sore throat. ; ; Cardiovascular: Negative for chest pain, palpitations, diaphoresis, dyspnea and peripheral edema. ; ; Respiratory: +cough. Negative for wheezing and stridor. ; ; Gastrointestinal: Negative for nausea, vomiting, diarrhea, abdominal pain, blood in stool, hematemesis, jaundice and rectal bleeding. . ; ; Genitourinary: Negative for dysuria, flank pain and hematuria. ; ; Musculoskeletal:  Negative for back pain and neck pain. Negative for deformity and trauma.; ; Skin: +rash to face and upper body. Negative for pruritus, abrasions, blisters, bruising and skin lesion.; ; Neuro: +headache, generalized weakness. Negative for lightheadedness and neck stiffness. Negative for altered  level of consciousness , altered mental status, extremity weakness, paresthesias, involuntary movement, seizure and syncope.     Allergies  Demerol  Home Medications   Prior to Admission medications   Medication Sig Start Date End Date Taking? Authorizing Provider  albuterol (VENTOLIN HFA) 108 (90 BASE) MCG/ACT inhaler Inhale into the lungs every 6 (six) hours as needed for wheezing or shortness of breath.    Historical Provider,  MD  Cholecalciferol (VITAMIN D) 1000 UNITS capsule Take 1,000 Units by mouth daily.    Historical Provider, MD  DULoxetine (CYMBALTA) 60 MG capsule Take 60 mg by mouth daily.    Historical Provider, MD  HYDROcodone-acetaminophen (LORTAB) 7.5-500 MG per tablet Take 1 tablet by mouth every 6 (six) hours as needed. For pain    Historical Provider, MD  hydrOXYzine (ATARAX/VISTARIL) 10 MG tablet Take 10 mg by mouth 4 (four) times daily.    Historical Provider, MD  insulin detemir (LEVEMIR) 100 UNIT/ML injection Inject 35 Units into the skin at bedtime.    Historical Provider, MD  insulin NPH-regular Human (NOVOLIN 70/30) (70-30) 100 UNIT/ML injection Inject 30 Units into the skin 2 (two) times daily with a meal.    Historical Provider, MD  lisinopril (PRINIVIL,ZESTRIL) 5 MG tablet Take 5 mg by mouth daily.      Historical Provider, MD  metFORMIN (GLUCOPHAGE) 1000 MG tablet Take 1 tablet (1,000 mg total) by mouth 2 (two) times daily with a meal. 06/21/11   Jettie Booze, MD  oxyCODONE-acetaminophen (PERCOCET) 7.5-325 MG per tablet Take 1 tablet by mouth every 4 (four) hours as needed.    Historical Provider, MD   BP 117/54 mmHg  Pulse 110  Temp(Src) 98.9 F (37.2 C) (Oral)  Resp 20  Ht 6\' 4"  (1.93 m)  Wt 230 lb (104.327 kg)  BMI 28.01 kg/m2  SpO2 93%   19:38:12 Orthostatic Vital Signs SF  Orthostatic Lying  - BP- Lying: 132/72 mmHg ; Pulse- Lying: 103  Orthostatic Sitting - BP- Sitting: 111/71 mmHg ; Pulse- Sitting: 111  Orthostatic Standing at 0 minutes - BP- Standing at 0 minutes: 96/66 mmHg ; Pulse- Standing at 0 minutes: 110     Filed Vitals:   10/28/14 1912 10/28/14 2030  BP: 117/54 108/63  Pulse: 110 101  Temp: 98.9 F (37.2 C)   TempSrc: Oral   Resp: 20 16  Height: 6\' 4"  (1.93 m)   Weight: 230 lb (104.327 kg)   SpO2: 93% 90%     Physical Exam  1920: Physical examination:  Nursing notes reviewed; Vital signs and O2 SAT reviewed;  Constitutional: Well developed, Well  nourished, In no acute distress; Head:  Normocephalic, atraumatic. +forehead, bridge of nose and periorbital edema bilat.; Eyes: EOMI, PERRL, No scleral icterus; ENMT: TM's clear bilat. +edemetous nasal turbinates bilat with clear rhinorrhea. Mouth and pharynx without lesions. No tonsillar exudates. No intra-oral edema. No submandibular or sublingual edema. No hoarse voice, no drooling, no stridor. No trismus.  Mouth and pharynx normal, Mucous membranes dry; Neck: Supple, Full range of motion, No lymphadenopathy; Cardiovascular: Tachycardic rate and rhythm, No gallop; Respiratory: Breath sounds clear & equal bilaterally, No rales, rhonchi, wheezes.  Speaking full sentences with ease, Normal respiratory effort/excursion; Chest: Nontender, Movement normal; Abdomen: Soft, Nontender, Nondistended, Normal bowel sounds; Genitourinary: No CVA tenderness; Extremities: Pulses normal, No tenderness, +1 pedal edema bilat. No calf asymmetry.; Neuro: AA&Ox3, Major CN grossly intact. No facial droop. Speech clear. No gross focal motor or sensory deficits in extremities. Climbs on  and off stretcher easily by himself. Gait steady.; Skin: Color normal, Warm, Dry. +plethora to entire head, neck, upper torso and upper arms bilat. No discoloration to lower torso, bilat lower arms, bilat legs.    ED Course  Procedures     EKG Interpretation   Date/Time:  Friday October 28 2014 19:38:38 EDT Ventricular Rate:  104 PR Interval:  147 QRS Duration: 92 QT Interval:  297 QTC Calculation: 391 R Axis:   80 Text Interpretation:  Sinus tachycardia RAE, consider biatrial enlargement  Baseline wander When compared with ECG of 06/19/2011 Rate faster Confirmed  by [facility]  MD, Nunzio Cory [phone]) on 10/28/2014 7:48:28 PM      MDM  MDM Reviewed: previous chart, nursing note and vitals Reviewed previous: labs and ECG Interpretation: labs, ECG, x-ray and CT scan   Results for orders placed or performed during the hospital encounter  of 10/28/14  CBC with Differential  Result Value Ref Range   WBC 14.1 (H) 4.0 - 10.5 K/uL   RBC 6.02 (H) 4.22 - 5.81 MIL/uL   Hemoglobin 16.8 13.0 - 17.0 g/dL   HCT 49.4 39.0 - 52.0 %   MCV 82.1 78.0 - 100.0 fL   MCH 27.9 26.0 - 34.0 pg   MCHC 34.0 30.0 - 36.0 g/dL   RDW 13.7 11.5 - 15.5 %   Platelets 125 (L) 150 - 400 K/uL   Neutrophils Relative % 75 43 - 77 %   Neutro Abs 10.6 (H) 1.7 - 7.7 K/uL   Lymphocytes Relative 13 12 - 46 %   Lymphs Abs 1.8 0.7 - 4.0 K/uL   Monocytes Relative 12 3 - 12 %   Monocytes Absolute 1.7 (H) 0.1 - 1.0 K/uL   Eosinophils Relative 0 0 - 5 %   Eosinophils Absolute 0.0 0.0 - 0.7 K/uL   Basophils Relative 0 0 - 1 %   Basophils Absolute 0.0 0.0 - 0.1 K/uL  Ethanol  Result Value Ref Range   Alcohol, Ethyl (B) <5 <5 mg/dL  Comprehensive metabolic panel  Result Value Ref Range   Sodium 132 (L) 135 - 145 mmol/L   Potassium 5.0 3.5 - 5.1 mmol/L   Chloride 92 (L) 101 - 111 mmol/L   CO2 29 22 - 32 mmol/L   Glucose, Bld 224 (H) 65 - 99 mg/dL   BUN 21 (H) 6 - 20 mg/dL   Creatinine, Ser 0.91 0.61 - 1.24 mg/dL   Calcium 8.7 (L) 8.9 - 10.3 mg/dL   Total Protein 6.6 6.5 - 8.1 g/dL   Albumin 3.2 (L) 3.5 - 5.0 g/dL   AST 22 15 - 41 U/L   ALT 28 17 - 63 U/L   Alkaline Phosphatase 76 38 - 126 U/L   Total Bilirubin 0.6 0.3 - 1.2 mg/dL   GFR calc non Af Amer >60 >60 mL/min   GFR calc Af Amer >60 >60 mL/min   Anion gap 11 5 - 15  Urinalysis, Routine w reflex microscopic (not at [facility])  Result Value Ref Range   Color, Urine AMBER (A) YELLOW   APPearance CLEAR CLEAR   Specific Gravity, Urine >1.030 (H) 1.005 - 1.030   pH 5.5 5.0 - 8.0   Glucose, UA NEGATIVE NEGATIVE mg/dL   Hgb urine dipstick SMALL (A) NEGATIVE   Bilirubin Urine MODERATE (A) NEGATIVE   Ketones, ur TRACE (A) NEGATIVE mg/dL   Protein, ur >300 (A) NEGATIVE mg/dL   Urobilinogen, UA 4.0 (H) 0.0 - 1.0 mg/dL   Nitrite NEGATIVE  NEGATIVE   Leukocytes, UA NEGATIVE NEGATIVE  Urine rapid drug screen  (hosp performed)  Result Value Ref Range   Opiates POSITIVE (A) NONE DETECTED   Cocaine NONE DETECTED NONE DETECTED   Benzodiazepines NONE DETECTED NONE DETECTED   Amphetamines NONE DETECTED NONE DETECTED   Tetrahydrocannabinol NONE DETECTED NONE DETECTED   Barbiturates NONE DETECTED NONE DETECTED  Troponin I  Result Value Ref Range   Troponin I <0.03 <0.031 ng/mL  Lactic acid, plasma  Result Value Ref Range   Lactic Acid, Venous 1.6 0.5 - 2.0 mmol/L  Ammonia  Result Value Ref Range   Ammonia 19 9 - 35 umol/L  Protime-INR  Result Value Ref Range   Prothrombin Time 14.5 11.6 - 15.2 seconds   INR 1.11 0.00 - 1.49  Urine microscopic-add on  Result Value Ref Range   Squamous Epithelial / LPF RARE RARE   WBC, UA 3-6 <3 WBC/hpf   RBC / HPF 0-2 <3 RBC/hpf   Bacteria, UA FEW (A) RARE   Urine-Other MUCOUS PRESENT   CBG monitoring, ED  Result Value Ref Range   Glucose-Capillary 237 (H) 65 - 99 mg/dL    Dg Chest 2 View 10/28/2014   CLINICAL DATA:  Headaches for 3 days with facial swelling today. Cough and congestion. No known injury. Initial encounter.  EXAM: CHEST  2 VIEW  COMPARISON:  09/23/2014 and 06/05/2013.  FINDINGS: The heart size and mediastinal contours are normal. The lungs are hyperinflated but clear. There is no pleural effusion or pneumothorax. No acute osseous findings are identified.  IMPRESSION: Stable examination with probable emphysema. No acute cardiopulmonary process.   Electronically Signed   By: Richardean Sale M.D.   On: 10/28/2014 20:18   Ct Head Wo Contrast 10/28/2014   CLINICAL DATA:  Acute onset of headache. Facial swelling. Swelling about both eyes. Initial encounter.  EXAM: CT HEAD WITHOUT CONTRAST  TECHNIQUE: Contiguous axial images were obtained from the base of the skull through the vertex without intravenous contrast.  COMPARISON:  None.  FINDINGS: There is no evidence of acute infarction, mass lesion, or intra- or extra-axial hemorrhage on CT.  The posterior  fossa, including the cerebellum, brainstem and fourth ventricle, is within normal limits. The third and lateral ventricles, and basal ganglia are unremarkable in appearance. The cerebral hemispheres are symmetric in appearance, with normal gray-white differentiation. No mass effect or midline shift is seen.  There is no evidence of fracture; visualized osseous structures are unremarkable in appearance. The visualized portions of the orbits are within normal limits. The paranasal sinuses and mastoid air cells are well-aerated. There is unusual soft tissue swelling noted overlying the right lateral orbit. Mild soft tissue swelling tracks overlying the bridge of the nose and frontal calvarium.  IMPRESSION: 1. No acute intracranial pathology seen on CT. 2. Soft tissue swelling overlying the right lateral orbit. Mild soft tissue swelling tracks overlying the bridge of the nose and frontal calvarium.   Electronically Signed   By: Garald Balding M.D.   On: 10/28/2014 20:12     2100:   Orthostatic on VS and appears clinically dehydrated; judicious IVF given. CT chest pending. Sign out to Dr. Jeneen Rinks.     Francine Graven, DO 10/28/14 2106

## 2014-10-28 NOTE — ED Notes (Addendum)
Patient complaining of headache x 3 days with facial swelling starting today. Noted swelling around bilateral eyes. Denies injury.

## 2014-10-28 NOTE — H&P (Signed)
[facility] History and Physical  Derek Campbell:[id number] DOB: December 05, 1960 DOA: 10/28/2014  Referring physician: Dr. Tanna Furry, EDP PCP: Monico Blitz, MD  Specialists:   Chief Complaint: Facial swelling and rash  HPI: Derek Campbell is a 54 y.o. male  With a history of diabetes, hypertension, COPD, but present to the emergency department with complaints of nausea, Avapro body rash and facial swelling. Patient states the symptoms started on Wednesday of this past week. He denies any recent changes in medications, foods, detergents or other products. Patient does state he smokes chronically. He has also had some shortness of breath. He denies any recent cough or ill contacts. She denies any chest pain, dizziness or headache. Patient does state he recently took a fluid pill for his lower extremity swelling. Patient denies any visual changes at this time, numbness or tingling. He denies any recent insect bites. In the emergency department, CT of the chest head were conducted. [facility] called for admission.  Review of Systems:  Constitutional: Denies fever, chills, diaphoresis, appetite change and fatigue.  HEENT: Complains of facial swelling Respiratory: Complains of some shortness of breath. Denies cough   Cardiovascular: Denies chest pain, palpitations and leg swelling.  Gastrointestinal: Complains of nausea and vomiting Genitourinary: Denies dysuria, urgency, frequency, hematuria, flank pain and difficulty urinating.  Musculoskeletal: Denies myalgias, back pain, joint swelling, arthralgias and gait problem.  Skin: Complains of rash  Neurological: Denies dizziness, seizures, syncope, weakness, light-headedness, numbness and headaches.  Hematological: Denies adenopathy. Easy bruising, personal or family bleeding history  Psychiatric/Behavioral: Denies suicidal ideation, mood changes, confusion, nervousness, sleep disturbance and agitation  Past Medical History  Diagnosis Date    . Coronary atherosclerosis of native coronary artery 2002    CATHETERIZATION   . Atrial flutter     status post TEE-guided cardioversion 2010  . Type II or unspecified type diabetes mellitus without mention of complication, not stated as uncontrolled   . Other and unspecified hyperlipidemia   . Chronic airway obstruction, not elsewhere classified   . Personal history of noncompliance with medical treatment, presenting hazards to health   . Tobacco use disorder   . Obesity, unspecified   . Other specified cardiac dysrhythmias(427.89)   . Chronic hepatitis C without mention of hepatic coma   . Alcohol abuse   . Polycythemia vera(238.4)   . Noncompliance   . Polysubstance abuse 2013  . Chronic back pain   . Chronic hip pain   . Chronic knee pain   . Pain management 2013   Past Surgical History  Procedure Laterality Date  . Right knee surgery  2007     Right knee arthroscopy, partial lateral meniscectomy,  . Left heart catheterization with coronary angiogram N/A 06/19/2011    Procedure: LEFT HEART CATHETERIZATION WITH CORONARY ANGIOGRAM;  Surgeon: Jettie Booze, MD;  Location: [facility] CATH LAB;  Service: Cardiovascular;  Laterality: N/A;  . Abdominal angiogram N/A 06/19/2011    Procedure: ABDOMINAL ANGIOGRAM;  Surgeon: Jettie Booze, MD;  Location: [facility] CATH LAB;  Service: Cardiovascular;  Laterality: N/A;  . Tee with cardioversion  2010    for atrial flutter   Social History:  reports that he has been smoking Cigarettes.  He has been smoking about 1.30 packs per day. He does not have any smokeless tobacco history on file. He reports that he does not drink alcohol. His drug history is not on file.  Allergies  Allergen Reactions  . Demerol [Meperidine] Hives    Family History Brother had  lung cancer.   Prior to Admission medications   Medication Sig Start Date End Date Taking? Authorizing Provider  albuterol (VENTOLIN HFA) 108 (90 BASE) MCG/ACT inhaler Inhale into the  lungs every 6 (six) hours as needed for wheezing or shortness of breath.   Yes Historical Provider, MD  aspirin EC 81 MG tablet Take 81 mg by mouth daily.   Yes Historical Provider, MD  lisinopril (PRINIVIL,ZESTRIL) 5 MG tablet Take 5 mg by mouth daily.     Yes Historical Provider, MD  metFORMIN (GLUCOPHAGE) 1000 MG tablet Take 1 tablet (1,000 mg total) by mouth 2 (two) times daily with a meal. 06/21/11  Yes Jettie Booze, MD  oxyCODONE-acetaminophen (PERCOCET) 7.5-325 MG per tablet Take 1 tablet by mouth every 4 (four) hours as needed for moderate pain or severe pain.   Yes Historical Provider, MD  tiZANidine (ZANAFLEX) 4 MG tablet Take 4 mg by mouth daily as needed. 10/10/14  Yes Historical Provider, MD   Physical Exam: Filed Vitals:   10/28/14 2208  BP: 125/89  Pulse: 104  Temp:   Resp: 26     General: Well developed, well nourished, NAD, appears stated age  HEENT: NCAT,orbital and nasal bridge edema, PERRLA, EOMI, Anicteic Sclera, mucous membranes moist.   Neck: Supple, no JVD, no masses  Cardiovascular: S1 S2 auscultated, tachycardia  Respiratory: Clear to auscultation bilaterally with equal chest rise  Abdomen: Soft, nontender, nondistended, + bowel sounds  Extremities: warm dry without cyanosis clubbing. Trace LE edema B/L  Neuro: AAOx3, cranial nerves grossly intact. Strength 5/5 in patient's upper and lower extremities bilaterally  Skin: Upper torso/chest,shoulder erythema, no blistering  Psych: Normal affect and demeanor with intact judgement and insight  Labs on Admission:  Basic Metabolic Panel:  Recent Labs Lab 10/28/14 1935  NA 132*  K 5.0  CL 92*  CO2 29  GLUCOSE 224*  BUN 21*  CREATININE 0.91  CALCIUM 8.7*   Liver Function Tests:  Recent Labs Lab 10/28/14 1935  AST 22  ALT 28  ALKPHOS 76  BILITOT 0.6  PROT 6.6  ALBUMIN 3.2*   No results for input(s): LIPASE, AMYLASE in the last 168 hours.  Recent Labs Lab 10/28/14 1935  AMMONIA  19   CBC:  Recent Labs Lab 10/28/14 1935  WBC 14.1*  NEUTROABS 10.6*  HGB 16.8  HCT 49.4  MCV 82.1  PLT 125*   Cardiac Enzymes:  Recent Labs Lab 10/28/14 1935  TROPONINI <0.03    BNP (last 3 results) No results for input(s): BNP in the last 8760 hours.  ProBNP (last 3 results) No results for input(s): PROBNP in the last 8760 hours.  CBG:  Recent Labs Lab 10/28/14 1943  GLUCAP 237*    Radiological Exams on Admission: Dg Chest 2 View  10/28/2014   CLINICAL DATA:  Headaches for 3 days with facial swelling today. Cough and congestion. No known injury. Initial encounter.  EXAM: CHEST  2 VIEW  COMPARISON:  09/23/2014 and 06/05/2013.  FINDINGS: The heart size and mediastinal contours are normal. The lungs are hyperinflated but clear. There is no pleural effusion or pneumothorax. No acute osseous findings are identified.  IMPRESSION: Stable examination with probable emphysema. No acute cardiopulmonary process.   Electronically Signed   By: Richardean Sale M.D.   On: 10/28/2014 20:18   Ct Head Wo Contrast  10/28/2014   CLINICAL DATA:  Acute onset of headache. Facial swelling. Swelling about both eyes. Initial encounter.  EXAM: CT HEAD WITHOUT CONTRAST  TECHNIQUE: Contiguous axial images were obtained from the base of the skull through the vertex without intravenous contrast.  COMPARISON:  None.  FINDINGS: There is no evidence of acute infarction, mass lesion, or intra- or extra-axial hemorrhage on CT.  The posterior fossa, including the cerebellum, brainstem and fourth ventricle, is within normal limits. The third and lateral ventricles, and basal ganglia are unremarkable in appearance. The cerebral hemispheres are symmetric in appearance, with normal gray-white differentiation. No mass effect or midline shift is seen.  There is no evidence of fracture; visualized osseous structures are unremarkable in appearance. The visualized portions of the orbits are within normal limits. The  paranasal sinuses and mastoid air cells are well-aerated. There is unusual soft tissue swelling noted overlying the right lateral orbit. Mild soft tissue swelling tracks overlying the bridge of the nose and frontal calvarium.  IMPRESSION: 1. No acute intracranial pathology seen on CT. 2. Soft tissue swelling overlying the right lateral orbit. Mild soft tissue swelling tracks overlying the bridge of the nose and frontal calvarium.   Electronically Signed   By: Garald Balding M.D.   On: 10/28/2014 20:12   Ct Chest W Contrast  10/28/2014   CLINICAL DATA:  Swelling the left face and jaw and upper chest, 3 days duration.  EXAM: CT CHEST WITH CONTRAST  TECHNIQUE: Multidetector CT imaging of the chest was performed during intravenous contrast administration.  CONTRAST:  50mL OMNIPAQUE IOHEXOL 300 MG/ML  SOLN  COMPARISON:  Radiographs 10/28/2014  FINDINGS: There are a few noncalcified peripheral nodules measuring up to 5 mm. These are indeterminate but can be followed conservatively. No suspicious masses are evident. Airways are patent. There is no adenopathy. There are no pleural effusions. No skeletal abnormalities are evident.  There is enlargement and nodularity of the thyroid, incompletely imaged. A 2.7 cm cyst or nodule is present in the lower pole right lobe which extends caudally behind the sternum.  IMPRESSION: 1. Several noncalcified pulmonary nodules are present, the largest measuring 5 mm. If the patient is at high risk for bronchogenic carcinoma, follow-up chest CT at 6-12 months is recommended. If the patient is at low risk for bronchogenic carcinoma, follow-up chest CT at 12 months is recommended. This recommendation follows the consensus statement: Guidelines for Management of Small Pulmonary Nodules Detected on CT Scans: A Statement from the [employer] as published in Radiology 2005;237:395-400. 2. No acute findings are evident in the chest. 3. Nodularity and enlargement of the thyroid,  incompletely imaged. Recommend thyroid sonography for evaluation.   Electronically Signed   By: Andreas Newport M.D.   On: 10/28/2014 21:27    EKG: Independently reviewed. Sinus tachycardia, rate 104  Assessment/Plan  Facial swelling/upper extremity rash -Secondary to unknown etiology at this time. Patient denies any new medications, products, or foods -Patient be admitted to telemetry for observation -Will place on IVF, Solu-Medrol, Benadryl, Zofran when necessary, loratadine -Will obtain CT maxillofacial -CT chest: Several noncalcified pulmonary nodules, recommend follow-up CT in 6-12 months, no acute findings, nodularity and enlargement of the thyroid -CT head showed no acute cranial pathology, soft tissue swelling overlying the right lateral orbit, bridge of nose and frontal calvarium  Hypertension -Will hold lisinopril, placed on metoprolol IV  Tachycardia -Will place on metoprolol, obtain TSH  Dyspnea -Patient smokes more than 2 packs of cigarettes per day -Will place on supplemental oxygen and neb treatments PRN -Chest x-ray negative for infection -Troponin negative -CT scan of the chest as noted above  Nausea and vomiting -  Possibly secondary to gastroenteritis -Continue Zofran  Leukocytosis -Possibly secondary to gastroenteritis, will continue to monitor CBC -CXR and UA showed no infection  Tobacco abuse -Patient counseled regarding tobacco cessation -Nicotine patch  Pulmonary nodules -Noted on CT scan, patient will need repeat CT in 6 months  Thyroid enlargement -Will obtain TSH level  Chronic pain -Continue home regimen  Hyponatremia, hypochloremia -Likely secondary dehydration will place patient on IV fluids and continue to monitor BMP  Mild thrombocytopenia -Continue to monitor CBC  DVT prophylaxis: Lovenox  Code Status: Full  Condition: Guarded  Family Communication: Family at bedside. Admission, patients condition and plan of care including  tests being ordered have been discussed with the patient and family who indicate understanding and agree with the plan and Code Status.  Disposition Plan: Admitted for observation   Time spent: 65 minutes  Karly Pitter D.O. [facility] Pager [phone]  If 7PM-7AM, please contact night-coverage [email or web address] Password [email or web address] 10/28/2014, 10:51 PM

## 2014-10-29 ENCOUNTER — Observation Stay (HOSPITAL_COMMUNITY): Payer: Medicare HMO

## 2014-10-29 DIAGNOSIS — R220 Localized swelling, mass and lump, head: Secondary | ICD-10-CM | POA: Diagnosis not present

## 2014-10-29 LAB — GLUCOSE, CAPILLARY
GLUCOSE-CAPILLARY: 227 mg/dL — AB (ref 65–99)
GLUCOSE-CAPILLARY: 416 mg/dL — AB (ref 65–99)
Glucose-Capillary: 249 mg/dL — ABNORMAL HIGH (ref 65–99)
Glucose-Capillary: 304 mg/dL — ABNORMAL HIGH (ref 65–99)
Glucose-Capillary: 365 mg/dL — ABNORMAL HIGH (ref 65–99)

## 2014-10-29 LAB — CBC
HCT: 48.1 % (ref 39.0–52.0)
HEMATOCRIT: 47.6 % (ref 39.0–52.0)
HEMOGLOBIN: 15.9 g/dL (ref 13.0–17.0)
Hemoglobin: 15.4 g/dL (ref 13.0–17.0)
MCH: 27.2 pg (ref 26.0–34.0)
MCH: 26.6 pg (ref 26.0–34.0)
MCHC: 33.1 g/dL (ref 30.0–36.0)
MCHC: 32.4 g/dL (ref 30.0–36.0)
MCV: 82.4 fL (ref 78.0–100.0)
MCV: 82.1 fL (ref 78.0–100.0)
Platelets: 139 10*3/uL — ABNORMAL LOW (ref 150–400)
Platelets: 120 10*3/uL — ABNORMAL LOW (ref 150–400)
RBC: 5.84 MIL/uL — AB (ref 4.22–5.81)
RBC: 5.8 MIL/uL (ref 4.22–5.81)
RDW: 13.7 % (ref 11.5–15.5)
RDW: 13.7 % (ref 11.5–15.5)
WBC: 10.4 10*3/uL (ref 4.0–10.5)
WBC: 8.3 10*3/uL (ref 4.0–10.5)

## 2014-10-29 LAB — BASIC METABOLIC PANEL
Anion gap: 11 (ref 5–15)
BUN: 19 mg/dL (ref 6–20)
CO2: 28 mmol/L (ref 22–32)
CREATININE: 0.73 mg/dL (ref 0.61–1.24)
Calcium: 8.5 mg/dL — ABNORMAL LOW (ref 8.9–10.3)
Chloride: 95 mmol/L — ABNORMAL LOW (ref 101–111)
GFR calc Af Amer: >60 mL/min (ref >60)
GLUCOSE: 298 mg/dL — AB (ref 65–99)
Potassium: 5.1 mmol/L (ref 3.5–5.1)
SODIUM: 134 mmol/L — AB (ref 135–145)

## 2014-10-29 LAB — GLUCOSE, RANDOM: GLUCOSE: 439 mg/dL — AB (ref 65–99)

## 2014-10-29 MED ORDER — SODIUM CHLORIDE 0.9 % IJ SOLN
3.0000 mL | Freq: Two times a day (BID) | INTRAMUSCULAR | Status: DC
  Administered 2014-10-29 (×3): 3 mL via INTRAVENOUS

## 2014-10-29 MED ORDER — METFORMIN HCL 500 MG PO TABS: 1000.0000 mg | ORAL_TABLET | Freq: Two times a day (BID) | ORAL | Status: DC

## 2014-10-29 MED ORDER — INSULIN ASPART 100 UNIT/ML ~~LOC~~ SOLN
0.0000 [IU] | Freq: Three times a day (TID) | SUBCUTANEOUS | Status: DC
  Administered 2014-10-29: 7 [IU] via SUBCUTANEOUS
  Administered 2014-10-29 – 2014-10-30 (×2): 3 [IU] via SUBCUTANEOUS
  Administered 2014-10-30: 7 [IU] via SUBCUTANEOUS

## 2014-10-29 MED ORDER — MORPHINE SULFATE 2 MG/ML IJ SOLN: 2.0000 mg | INTRAMUSCULAR | Status: DC | PRN

## 2014-10-29 MED ORDER — SODIUM CHLORIDE 0.9 % IV SOLN
INTRAVENOUS | Status: DC
  Administered 2014-10-29: 75 mL/h via INTRAVENOUS

## 2014-10-29 MED ORDER — ENOXAPARIN SODIUM 40 MG/0.4ML ~~LOC~~ SOLN
40.0000 mg | SUBCUTANEOUS | Status: DC
  Administered 2014-10-29 – 2014-10-30 (×2): 40 mg via SUBCUTANEOUS
  Filled 2014-10-29 (×2): qty 0.4

## 2014-10-29 MED ORDER — OXYCODONE-ACETAMINOPHEN 7.5-325 MG PO TABS
1.0000 | ORAL_TABLET | ORAL | Status: DC | PRN
  Administered 2014-10-29 – 2014-10-30 (×9): 1 via ORAL
  Filled 2014-10-29 (×9): qty 1

## 2014-10-29 MED ORDER — DIPHENHYDRAMINE HCL 25 MG PO CAPS: 25.0000 mg | ORAL_CAPSULE | Freq: Four times a day (QID) | ORAL | Status: DC | PRN

## 2014-10-29 MED ORDER — METHYLPREDNISOLONE SODIUM SUCC 125 MG IJ SOLR
60.0000 mg | Freq: Two times a day (BID) | INTRAMUSCULAR | Status: DC
  Administered 2014-10-29: 60 mg via INTRAVENOUS
  Filled 2014-10-29: qty 2

## 2014-10-29 MED ORDER — NAPHAZOLINE-PHENIRAMINE 0.025-0.3 % OP SOLN
1.0000 [drp] | Freq: Four times a day (QID) | OPHTHALMIC | Status: DC | PRN
  Administered 2014-10-29 – 2014-10-30 (×2): 1 [drp] via OPHTHALMIC
  Filled 2014-10-29 (×3): qty 5

## 2014-10-29 MED ORDER — ASPIRIN EC 81 MG PO TBEC
81.0000 mg | DELAYED_RELEASE_TABLET | Freq: Every day | ORAL | Status: DC
  Administered 2014-10-29 – 2014-10-30 (×2): 81 mg via ORAL
  Filled 2014-10-29 (×2): qty 1

## 2014-10-29 MED ORDER — NICOTINE 21 MG/24HR TD PT24
21.0000 mg | MEDICATED_PATCH | Freq: Every day | TRANSDERMAL | Status: DC
  Administered 2014-10-29 (×3): 21 mg via TRANSDERMAL
  Filled 2014-10-29 (×4): qty 1

## 2014-10-29 MED ORDER — LORATADINE 10 MG PO TABS
10.0000 mg | ORAL_TABLET | Freq: Every day | ORAL | Status: DC
  Administered 2014-10-29 – 2014-10-30 (×2): 10 mg via ORAL
  Filled 2014-10-29 (×2): qty 1

## 2014-10-29 MED ORDER — ONDANSETRON HCL 4 MG PO TABS: 4.0000 mg | ORAL_TABLET | Freq: Four times a day (QID) | ORAL | Status: DC | PRN

## 2014-10-29 MED ORDER — CETYLPYRIDINIUM CHLORIDE 0.05 % MT LIQD
7.0000 mL | Freq: Two times a day (BID) | OROMUCOSAL | Status: DC
  Administered 2014-10-29 – 2014-10-30 (×3): 7 mL via OROMUCOSAL

## 2014-10-29 MED ORDER — PREDNISONE 20 MG PO TABS
40.0000 mg | ORAL_TABLET | Freq: Every day | ORAL | Status: DC
  Administered 2014-10-29 – 2014-10-30 (×2): 40 mg via ORAL
  Filled 2014-10-29 (×2): qty 2

## 2014-10-29 MED ORDER — ACETAMINOPHEN 325 MG PO TABS: 650.0000 mg | ORAL_TABLET | Freq: Four times a day (QID) | ORAL | Status: DC | PRN

## 2014-10-29 MED ORDER — ACETAMINOPHEN 650 MG RE SUPP: 650.0000 mg | Freq: Four times a day (QID) | RECTAL | Status: DC | PRN

## 2014-10-29 MED ORDER — IPRATROPIUM-ALBUTEROL 0.5-2.5 (3) MG/3ML IN SOLN
3.0000 mL | Freq: Four times a day (QID) | RESPIRATORY_TRACT | Status: DC
  Administered 2014-10-29 (×4): 3 mL via RESPIRATORY_TRACT
  Filled 2014-10-29 (×4): qty 3

## 2014-10-29 MED ORDER — DILTIAZEM HCL 60 MG PO TABS
60.0000 mg | ORAL_TABLET | Freq: Two times a day (BID) | ORAL | Status: DC
  Administered 2014-10-29 – 2014-10-30 (×3): 60 mg via ORAL
  Filled 2014-10-29 (×3): qty 1

## 2014-10-29 MED ORDER — IPRATROPIUM-ALBUTEROL 0.5-2.5 (3) MG/3ML IN SOLN
3.0000 mL | Freq: Three times a day (TID) | RESPIRATORY_TRACT | Status: DC
  Administered 2014-10-30: 3 mL via RESPIRATORY_TRACT
  Filled 2014-10-29: qty 3

## 2014-10-29 MED ORDER — ONDANSETRON HCL 4 MG/2ML IJ SOLN: 4.0000 mg | Freq: Four times a day (QID) | INTRAMUSCULAR | Status: DC | PRN

## 2014-10-29 MED ORDER — METOPROLOL TARTRATE 1 MG/ML IV SOLN: 2.5000 mg | Freq: Four times a day (QID) | INTRAVENOUS | Status: DC

## 2014-10-29 MED ORDER — INSULIN ASPART 100 UNIT/ML ~~LOC~~ SOLN
0.0000 [IU] | Freq: Every day | SUBCUTANEOUS | Status: DC
  Administered 2014-10-29: 5 [IU] via SUBCUTANEOUS
  Administered 2014-10-29: 2 [IU] via SUBCUTANEOUS

## 2014-10-29 MED ORDER — TIZANIDINE HCL 4 MG PO TABS: 4.0000 mg | ORAL_TABLET | Freq: Four times a day (QID) | ORAL | Status: DC | PRN

## 2014-10-29 MED ORDER — INSULIN ASPART 100 UNIT/ML ~~LOC~~ SOLN
15.0000 [IU] | Freq: Once | SUBCUTANEOUS | Status: AC
  Administered 2014-10-29: 15 [IU] via SUBCUTANEOUS

## 2014-10-29 MED ORDER — METOPROLOL TARTRATE 1 MG/ML IV SOLN
2.5000 mg | Freq: Four times a day (QID) | INTRAVENOUS | Status: DC | PRN
Start: 2014-10-29 — End: 2014-10-30

## 2014-10-29 NOTE — Progress Notes (Signed)
PHARMACIST - PHYSICIAN COMMUNICATION DR:  Verlon Au CONCERNING:  METFORMIN SAFE ADMINISTRATION POLICY  RECOMMENDATION: Metformin has been placed on DISCONTINUE (rejected order) STATUS and should be reordered only after any of the conditions below are ruled out.  Current safety recommendations include avoiding metformin for a minimum of 48 hours after the patient's exposure to intravenous contrast media.  DESCRIPTION:  The Pharmacy Committee has adopted a policy that restricts the use of metformin in hospitalized patients until all the contraindications to administration have been ruled out. Specific contraindications are: []  Serum creatinine ? 1.5 for males []  Serum creatinine ? 1.4 for females []  Shock, acute MI, sepsis, hypoxemia, dehydration [x]  Planned administration of intravenous iodinated contrast media []  Heart Failure patients with low EF []  Acute or chronic metabolic acidosis (including DKA)

## 2014-10-29 NOTE — Progress Notes (Signed)
Derek Campbell:[id number] DOB: 1960-07-09 DOA: 10/28/2014 PCP: Monico Blitz, MD  Brief narrative: 54 y/o ?  Body mass index is 27.08 kg/(m^2).  S/p Cardiac cath 06/19/11-non-obst CAD, EF 55% S/p TEE CV for P Aflutter in the remote past 2010 DM ty II H/o Hep C Continued tob abuse in setting prior polysubstanced abuse Htn Had Lipoma surgery 2013 and "never healed"   Admitted wit Facial swelling and upper ext rash-disabled since 2007-used to be a [employer].  On lisinopril.  Rash started in the head-Head was sore-couldn;t comb hair-vision was also a little blurred, was vomiting as well.  No diarrhea--is on pain meds and that "messes up his stool "--has chr LBP--Sees Dr. Ladora Daniel for pain--no change in pain meds recently    Past medical history-As per Problem list Chart reviewed as below-   Subjective   Feels better swellign int he face is better No cp No sob No blurred nor double vision   Objective    Interim History:   Telemetry:    Objective: Filed Vitals:   10/28/14 2331 10/29/14 0007 10/29/14 0255 10/29/14 0517  BP: 128/75 130/59  127/71  Pulse: 99 98  80  Temp: 99.2 F (37.3 C) 98.2 F (36.8 C)  97.8 F (36.6 C)  TempSrc: Oral Oral  Oral  Resp: 20   20  Height:  6\' 4"  (1.93 m)    Weight:  100.88 kg (222 lb 6.4 oz)    SpO2: 92% 96% 88% 93%    Intake/Output Summary (Last 24 hours) at 10/29/14 0723 Last data filed at 10/29/14 0657  Gross per 24 hour  Intake 1508.75 ml  Output      0 ml  Net 1508.75 ml    Exam:  General: swelling of the face on the R side of face with some confluence Cardiovascular: S1-S2 no murmur rub or gallop Respiratory: Clinically clear no added sound Abdomen: Soft nontender nondistended no rebound Skin papular squama/upper back, hairline not examined, face significant for swollen on the right side without issues Neuro neurologically intact  Data Reviewed: Basic Metabolic Panel:  Recent Labs Lab 10/28/14 1935  10/29/14 0628  NA 132* 134*  K 5.0 5.1  CL 92* 95*  CO2 29 28  GLUCOSE 224* 298*  BUN 21* 19  CREATININE 0.91 0.73  CALCIUM 8.7* 8.5*   Liver Function Tests:  Recent Labs Lab 10/28/14 1935  AST 22  ALT 28  ALKPHOS 76  BILITOT 0.6  PROT 6.6  ALBUMIN 3.2*   No results for input(s): LIPASE, AMYLASE in the last 168 hours.  Recent Labs Lab 10/28/14 1935  AMMONIA 19   CBC:  Recent Labs Lab 10/28/14 1935 10/29/14 0628  WBC 14.1* 10.4  NEUTROABS 10.6*  --   HGB 16.8 15.9  HCT 49.4 48.1  MCV 82.1 82.4  PLT 125* 139*   Cardiac Enzymes:  Recent Labs Lab 10/28/14 1935  TROPONINI <0.03   BNP: Invalid input(s): POCBNP CBG:  Recent Labs Lab 10/28/14 1943 10/29/14 0038  GLUCAP 237* 227*    No results found for this or any previous visit (from the past 240 hour(s)).   Studies:              All Imaging reviewed and is as per above notation   Scheduled Meds: . antiseptic oral rinse  7 mL Mouth Rinse BID  . aspirin EC  81 mg Oral Daily  . diltiazem  60 mg Oral Q12H  . enoxaparin (LOVENOX) injection  40 mg  Subcutaneous Q24H  . insulin aspart  0-5 Units Subcutaneous QHS  . insulin aspart  0-9 Units Subcutaneous TID WC  . ipratropium-albuterol  3 mL Nebulization Q6H  . loratadine  10 mg Oral Daily  . nicotine  21 mg Transdermal Daily  . predniSONE  40 mg Oral QAC breakfast  . sodium chloride  3 mL Intravenous Q12H   Continuous Infusions: . sodium chloride 100 mL/hr at 10/28/14 2018  . sodium chloride 75 mL/hr (10/29/14 0101)     Assessment/Plan:    Facial swelling Etiology is not clearDiscontinue forever ACE inhibitor Hold metformin for now Transition Solu-Medrol 60 every 12 to prednisone 40 today--quick taper given DM Monitor swelling ? Discharge 8/7  Active Problems:   Diabetes mellitus Metformin on hold as above Reimplement a.m. 8/7 Will need an outpatient HbA1c as well as retinopathy, neuropathy screening Expect CBG will be elevated with  steroids-needs a taper    TOBACCO ABUSE Counseled patient quit smoking Pre-contemplative    Essential hypertension, benign Consider amlodipine and forward    Chronic pain Patient will need to follow-up with [facility] for refills    Appt with PCP: not yet Code Status: Full code Family Communication:no  family + Disposition Plan: home soon DVT prophylaxis: SCD Consultants:   Verneita Griffes, MD  [facility] Pager [phone] 10/29/2014, 7:23 AM

## 2014-10-29 NOTE — Progress Notes (Signed)
Pt refusing IV fluids at this time. Pt states, "I don't need the IV fluids. I'm drinking plenty of water". Paged Dr Rogue Bussing. Will continue to monitor. Bed remains in lowest position and call bell is within reach.

## 2014-10-30 DIAGNOSIS — R21 Rash and other nonspecific skin eruption: Secondary | ICD-10-CM

## 2014-10-30 DIAGNOSIS — R220 Localized swelling, mass and lump, head: Secondary | ICD-10-CM | POA: Diagnosis not present

## 2014-10-30 LAB — GLUCOSE, CAPILLARY
GLUCOSE-CAPILLARY: 247 mg/dL — AB (ref 65–99)
GLUCOSE-CAPILLARY: 304 mg/dL — AB (ref 65–99)

## 2014-10-30 LAB — COMPREHENSIVE METABOLIC PANEL
ALK PHOS: 64 U/L (ref 38–126)
ALT: 26 U/L (ref 17–63)
ANION GAP: 9 (ref 5–15)
AST: 20 U/L (ref 15–41)
Albumin: 3 g/dL — ABNORMAL LOW (ref 3.5–5.0)
BUN: 23 mg/dL — ABNORMAL HIGH (ref 6–20)
CALCIUM: 8.5 mg/dL — AB (ref 8.9–10.3)
CO2: 30 mmol/L (ref 22–32)
Chloride: 96 mmol/L — ABNORMAL LOW (ref 101–111)
Creatinine, Ser: 0.6 mg/dL — ABNORMAL LOW (ref 0.61–1.24)
GFR calc non Af Amer: >60 mL/min (ref >60)
Glucose, Bld: 275 mg/dL — ABNORMAL HIGH (ref 65–99)
Potassium: 4 mmol/L (ref 3.5–5.1)
SODIUM: 135 mmol/L (ref 135–145)
Total Bilirubin: 0.4 mg/dL (ref 0.3–1.2)
Total Protein: 6.5 g/dL (ref 6.5–8.1)

## 2014-10-30 MED ORDER — DIPHENHYDRAMINE HCL 25 MG PO CAPS: 25.0000 mg | ORAL_CAPSULE | Freq: Four times a day (QID) | ORAL | Status: AC | PRN

## 2014-10-30 MED ORDER — METFORMIN HCL 500 MG PO TABS: 1000.0000 mg | ORAL_TABLET | Freq: Two times a day (BID) | ORAL | Status: DC

## 2014-10-30 MED ORDER — CHLORTHALIDONE 25 MG PO TABS: 25.0000 mg | ORAL_TABLET | Freq: Every day | ORAL | Status: AC

## 2014-10-30 MED ORDER — PREDNISONE 10 MG PO TABS: ORAL_TABLET | ORAL | Status: AC

## 2014-10-30 NOTE — Progress Notes (Signed)
PROGRESS NOTE  BENNO BRENSINGER XVQ:[id number] DOB: 1960/12/19 DOA: 10/28/2014 PCP: Monico Blitz, MD  Summary: 29 yom with a history of DM, HTN, and COPD ,presented to the emergency department with complaints of headache, rash, fatigue and facial swelling starting 8/3. Admitted for facial swelling, UE rash.  Assessment/Plan: 1. Facial swelling, upper body rash. Resolving rapidly. Etiology unclear but suspect lisinopril (atypical angioedema and photosensitivity--given distribution of initial redness). Also consider ASA which he just started, however rash would not be expected with this and he improved while continuing to take this medication. CT head noted subq swelling only. Treated with steroids. ACE-I stopped. There is no mucous membrane involvement, no discrete lesions or evidence of TEN. 2. Orthostatic hypotension, resolved 3. Dehydration, resolved with IVF. 4. Nausea and vomiting, resolved.  5. Leukocytosis, resolved. CXR and U/A unremarkable.  6. DM. Type 2 Stable. Metformin is being held. 7. Tobacco dependence, recommend cessation. 8. HTN. Stable 9. Chronic pain on Percocet, Zanaflex 10. Polysubstance abuse. 11. Hepatitis C 12. Thrombocytopenia, stable, etiology unclear. F/u as an outpatient.  13. CT chest: Several noncalcified pulmonary nodules, recommend follow-up CT in 6-12 months, no acute findings, nodularity and enlargement of the thyroid   Overall improved, very little facial edema rash suspect related to ACE inhibitor but also stop ASA.  Discharge on prednisone and benadryl as needed.  Discontinue Lisinopril and ASA.  Follow up with PCP in one week.  F/u CT chest in 6-12 months to reevaluate nodules (discussed with patient)  F/u TSH, consider outpatient thyroid evaluation (discussed with patient)  F/u thrombocytopenia as an outpaient.  Code Status: Full DVT prophylaxis: Lovenox Family Communication: Discussed care plan with patient. Understands and has no  concerns. Disposition Plan: Discharge home today  Murray Hodgkins, MD  [facility]  Pager [phone] If 7PM-7AM, please contact night-coverage at [email or web address], password [email or web address] 10/30/2014, 6:36 AM    Consultants:    Procedures:    Antibiotics:    HPI/Subjective: Feels better.The swelling has gone down in his face and he is able to see out of his right eye. Denies lip or tongue being swollen. Rash resolving. No reports of chest pain, shortness of breath, n/v/d, or abd pain.  Objective: Filed Vitals:   10/29/14 1520 10/29/14 1953 10/29/14 2119 10/30/14 0518  BP: 130/66  123/81 135/74  Pulse: 80  82 73  Temp: 97.7 F (36.5 C)  98 F (36.7 C) 97.7 F (36.5 C)  TempSrc: Oral  Oral Oral  Resp: 20  20 20   Height:      Weight:      SpO2: 93% 92% 93% 92%    Intake/Output Summary (Last 24 hours) at 10/30/14 0636 Last data filed at 10/30/14 0600  Gross per 24 hour  Intake 2828.75 ml  Output      0 ml  Net 2828.75 ml      Filed Weights   10/28/14 1912 10/29/14 0007  Weight: 104.327 kg (230 lb) 100.88 kg (222 lb 6.4 oz)    Exam:   Afebrile, VSS, not hypoxic spO2 93% on RA General:  Appears calm and comfortable, sitting up in bed.  Eyes: Right infraorbital edema, some lid edema on right, EOMI both eyes, normal pupils. ENT: grossly normal hearing,no lip or tongue edema. Cardiovascular: RRR, no m/r/g. No LE edema. Respiratory: CTA bilaterally, no w/r/r. Normal respiratory effort. Skin:  Nonspecific redness of upper chest and back symmetric bilaterally, scalp looks unremarkable Psychiatric: grossly normal mood and affect, speech fluent and appropriate  New data reviewed:  Blood sugars stable  CMP unremarkable, potassium normal  Pertinent data since admission:    Pending data:    Scheduled Meds: . antiseptic oral rinse  7 mL Mouth Rinse BID  . aspirin EC  81 mg Oral Daily  . diltiazem  60 mg Oral Q12H  . enoxaparin (LOVENOX) injection  40 mg  Subcutaneous Q24H  . insulin aspart  0-5 Units Subcutaneous QHS  . insulin aspart  0-9 Units Subcutaneous TID WC  . ipratropium-albuterol  3 mL Nebulization TID  . loratadine  10 mg Oral Daily  . nicotine  21 mg Transdermal Daily  . predniSONE  40 mg Oral QAC breakfast  . sodium chloride  3 mL Intravenous Q12H   Continuous Infusions: . sodium chloride 100 mL/hr at 10/28/14 2018    Principal Problem:   Facial swelling Active Problems:   Diabetes mellitus   TOBACCO ABUSE   Essential hypertension, benign   Dyspnea   Tachycardia   Chronic pain    I, Jessica D. Leonie Green, acting as scribe, recorded this note contemporaneously in the presence of Dr. Melene Plan. Sarajane Jews, M.D. on 10/30/2014 .  I have reviewed the above documentation for accuracy and completeness, and I agree with the above. Murray Hodgkins, MD

## 2014-10-30 NOTE — Discharge Summary (Signed)
Physician Discharge Summary  SUHAN PACI NUU:[id number] DOB: December 08, 1960 DOA: 10/28/2014  PCP: Monico Blitz, MD  Admit date: 10/28/2014 Discharge date: 10/30/2014  Recommendations for Outpatient Follow-up:  1. Discharge on prednisone and benadryl as needed. 2. Discontinue Lisinopril and ASA, see below. Started on chlorthalidone. 3. F/u CT chest in 6-12 months to reevaluate nodules (discussed with patient) 4. F/u TSH, consider outpatient thyroid evaluation (discussed with patient) 5. F/u thrombocytopenia as an outpatient    Follow-up Information    Follow up with [facility], [place]. Schedule an appointment as soon as possible for a visit in 1 week.   Specialty:  Internal Medicine   Contact information:   [address]  [place] [address] [phone]        Discharge Diagnoses:  1. Facial swelling with upper body rash, scalp 2. Orthostatic hypotension 3. Dehydration 4. Nausea and vomiting 5. DM. Type 2  6. Tobacco dependence 7. Hepatitis C 8. Thrombocytopenia 9. Pulmonary nodules  10. Enlarged thyroid  Discharge Condition: Improved Disposition: Discharge home  Diet recommendation: Heart healthy  Filed Weights   10/28/14 1912 10/29/14 0007  Weight: 104.327 kg (230 lb) 100.88 kg (222 lb 6.4 oz)    History of present illness:  54 yom with a history of DM, HTN, and COPD ,presented to the emergency department with complaints of headache, rash, fatigue and facial swelling starting 8/3. Admitted for facial swelling, UE rash.  Hospital Course:  Mr. Grantz was placed on  IVF, Solu-Medrol, Benadryl, Zofran when necessary, loratadine with rapid resolution. Ct scan showed pulmonary nodules and enlarged thyroids, but otherwise unremarkable. Recommended to stop taking the Lisinopril and ASA until PCP follow up in one week. Hospitalization uncomplicated.   1. Facial swelling, upper body rash. Resolving rapidly. Etiology unclear but suspect lisinopril (atypical angioedema and  photosensitivity--given distribution of initial redness). Also consider ASA which he just started, however rash would not be expected with this and he improved while continuing to take this medication. CT head noted subq swelling only. Treated with steroids. ACE-I stopped. There is no mucous membrane involvement, no discrete lesions or evidence of TEN. 2. Orthostatic hypotension, resolved 3. Dehydration, resolved with IVF. 4. Nausea and vomiting, resolved.  5. Leukocytosis, resolved. CXR and U/A unremarkable.  6. DM. Type 2 Stable. Metformin is being held. 7. Tobacco dependence, recommend cessation. 8. HTN. Stable 9. Chronic pain on Percocet, Zanaflex 10. Polysubstance abuse. 11. Hepatitis C 12. Thrombocytopenia, stable, etiology unclear. F/u as an outpatient.  13. CT chest: Several noncalcified pulmonary nodules, recommend follow-up CT in 6-12 months, no acute findings, nodularity and enlargement of the thyroid  No consults, procedures, or antibiotics.    Discharge Instructions   Current Discharge Medication List    START taking these medications   Details  chlorthalidone (HYGROTON) 25 MG tablet Take 1 tablet (25 mg total) by mouth daily. Qty: 30 tablet, Refills: 0    diphenhydrAMINE (BENADRYL) 25 mg capsule Take 1 capsule (25 mg total) by mouth every 6 (six) hours as needed for itching or allergies.    predniSONE (DELTASONE) 10 MG tablet Take 40 mg by mouth daily for 1 days, then take 20 mg by mouth daily for 3 days, then take 10 mg by mouth daily for 3 days, then stop. Qty: 13 tablet, Refills: 0      CONTINUE these medications which have NOT CHANGED   Details  albuterol (VENTOLIN HFA) 108 (90 BASE) MCG/ACT inhaler Inhale into the lungs every 6 (six) hours as needed for wheezing or  shortness of breath.    metFORMIN (GLUCOPHAGE) 1000 MG tablet Take 1 tablet (1,000 mg total) by mouth 2 (two) times daily with a meal.    oxyCODONE-acetaminophen (PERCOCET) 7.5-325 MG per  tablet Take 1 tablet by mouth every 4 (four) hours as needed for moderate pain or severe pain.    tiZANidine (ZANAFLEX) 4 MG tablet Take 4 mg by mouth daily as needed.      STOP taking these medications     aspirin EC 81 MG tablet      lisinopril (PRINIVIL,ZESTRIL) 5 MG tablet        Allergies  Allergen Reactions  . Demerol [Meperidine] Hives    The results of significant diagnostics from this hospitalization (including imaging, microbiology, ancillary and laboratory) are listed below for reference.    Significant Diagnostic Studies: Dg Chest 2 View  10/28/2014   CLINICAL DATA:  Headaches for 3 days with facial swelling today. Cough and congestion. No known injury. Initial encounter.  EXAM: CHEST  2 VIEW  COMPARISON:  09/23/2014 and 06/05/2013.  FINDINGS: The heart size and mediastinal contours are normal. The lungs are hyperinflated but clear. There is no pleural effusion or pneumothorax. No acute osseous findings are identified.  IMPRESSION: Stable examination with probable emphysema. No acute cardiopulmonary process.   Electronically Signed   By: Richardean Sale M.D.   On: 10/28/2014 20:18   Ct Head Wo Contrast  10/28/2014   CLINICAL DATA:  Acute onset of headache. Facial swelling. Swelling about both eyes. Initial encounter.  EXAM: CT HEAD WITHOUT CONTRAST  TECHNIQUE: Contiguous axial images were obtained from the base of the skull through the vertex without intravenous contrast.  COMPARISON:  None.  FINDINGS: There is no evidence of acute infarction, mass lesion, or intra- or extra-axial hemorrhage on CT.  The posterior fossa, including the cerebellum, brainstem and fourth ventricle, is within normal limits. The third and lateral ventricles, and basal ganglia are unremarkable in appearance. The cerebral hemispheres are symmetric in appearance, with normal gray-white differentiation. No mass effect or midline shift is seen.  There is no evidence of fracture; visualized osseous structures  are unremarkable in appearance. The visualized portions of the orbits are within normal limits. The paranasal sinuses and mastoid air cells are well-aerated. There is unusual soft tissue swelling noted overlying the right lateral orbit. Mild soft tissue swelling tracks overlying the bridge of the nose and frontal calvarium.  IMPRESSION: 1. No acute intracranial pathology seen on CT. 2. Soft tissue swelling overlying the right lateral orbit. Mild soft tissue swelling tracks overlying the bridge of the nose and frontal calvarium.   Electronically Signed   By: Garald Balding M.D.   On: 10/28/2014 20:12   Ct Chest W Contrast  10/28/2014   CLINICAL DATA:  Swelling the left face and jaw and upper chest, 3 days duration.  EXAM: CT CHEST WITH CONTRAST  TECHNIQUE: Multidetector CT imaging of the chest was performed during intravenous contrast administration.  CONTRAST:  42mL OMNIPAQUE IOHEXOL 300 MG/ML  SOLN  COMPARISON:  Radiographs 10/28/2014  FINDINGS: There are a few noncalcified peripheral nodules measuring up to 5 mm. These are indeterminate but can be followed conservatively. No suspicious masses are evident. Airways are patent. There is no adenopathy. There are no pleural effusions. No skeletal abnormalities are evident.  There is enlargement and nodularity of the thyroid, incompletely imaged. A 2.7 cm cyst or nodule is present in the lower pole right lobe which extends caudally behind the sternum.  IMPRESSION:  1. Several noncalcified pulmonary nodules are present, the largest measuring 5 mm. If the patient is at high risk for bronchogenic carcinoma, follow-up chest CT at 6-12 months is recommended. If the patient is at low risk for bronchogenic carcinoma, follow-up chest CT at 12 months is recommended. This recommendation follows the consensus statement: Guidelines for Management of Small Pulmonary Nodules Detected on CT Scans: A Statement from the [employer] as published in Radiology 2005;237:395-400. 2.  No acute findings are evident in the chest. 3. Nodularity and enlargement of the thyroid, incompletely imaged. Recommend thyroid sonography for evaluation.   Electronically Signed   By: Andreas Newport M.D.   On: 10/28/2014 21:27   Labs: Basic Metabolic Panel:  Recent Labs Lab 10/28/14 1935 10/29/14 0628 10/29/14 1757  NA 132* 134*  --   K 5.0 5.1  --   CL 92* 95*  --   CO2 29 28  --   GLUCOSE 224* 298* 439*  BUN 21* 19  --   CREATININE 0.91 0.73  --   CALCIUM 8.7* 8.5*  --    Liver Function Tests:  Recent Labs Lab 10/28/14 1935  AST 22  ALT 28  ALKPHOS 76  BILITOT 0.6  PROT 6.6  ALBUMIN 3.2*    Recent Labs Lab 10/28/14 1935  AMMONIA 19   CBC:  Recent Labs Lab 10/28/14 1935 10/29/14 0628 10/29/14 0926  WBC 14.1* 10.4 8.3  NEUTROABS 10.6*  --   --   HGB 16.8 15.9 15.4  HCT 49.4 48.1 47.6  MCV 82.1 82.4 82.1  PLT 125* 139* 120*   Cardiac Enzymes:  Recent Labs Lab 10/28/14 1935  TROPONINI <0.03   CBG:  Recent Labs Lab 10/29/14 0038 10/29/14 0734 10/29/14 1114 10/29/14 1637 10/29/14 2010  GLUCAP 227* 249* 304* 416* 365*    Principal Problem:   Facial swelling Active Problems:   Diabetes mellitus   TOBACCO ABUSE   Essential hypertension, benign   Dyspnea   Tachycardia   Chronic pain   Time coordinating discharge: 40 minutes  Signed:  Murray Hodgkins, MD [facility] 10/30/2014, 6:44 AM   I, Laban Emperor. Leonie Green, acting as scribe, recorded this note contemporaneously in the presence of Dr. Melene Plan. Sarajane Jews, M.D. on 10/30/2014 .  I have reviewed the above documentation for accuracy and completeness, and I agree with the above. Murray Hodgkins, MD

## 2014-10-31 LAB — URINE CULTURE: Culture: NO GROWTH

## 2015-05-12 ENCOUNTER — Telehealth: Payer: Self-pay | Admitting: Vascular Surgery

## 2015-05-12 NOTE — Telephone Encounter (Signed)
Pt's sister called to say her brother passed away last night, May 16, 2015. He  was to see Dr. Scot Dock on 05/24/2015 at 10:00 am. Please cancel appointment, thank you.

## 2015-05-24 ENCOUNTER — Encounter: Payer: Self-pay | Admitting: Vascular Surgery

## 2015-05-24 DEATH — deceased

## 2016-05-16 IMAGING — CT CT HEAD W/O CM
1 series · 15 of 30 positions shown, 19 images · non-contrast
Comparison: None.

CLINICAL DATA: Acute onset of headache. Facial swelling. Swelling
about both eyes. Initial encounter.

EXAM:
CT HEAD WITHOUT CONTRAST
TECHNIQUE: Contiguous axial images were obtained from the base of the skull
through the vertex without intravenous contrast.

[Series 2: headseq 4.8 h37s · axial · 0.51mm/px · z∈[+331,+486]mm · 15 of 36 slices shown, 19 images]
[im 2/36  brain]
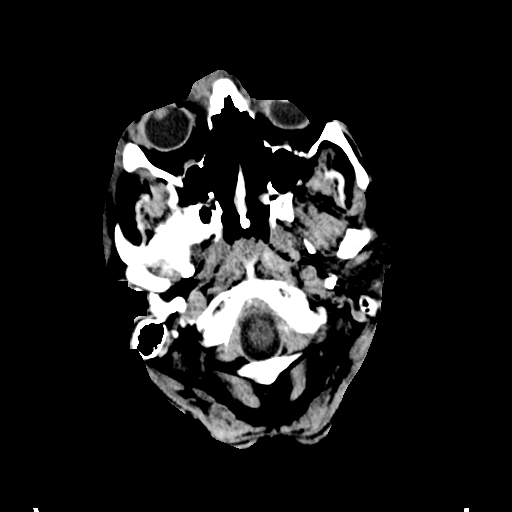
[im 2/36  bone]
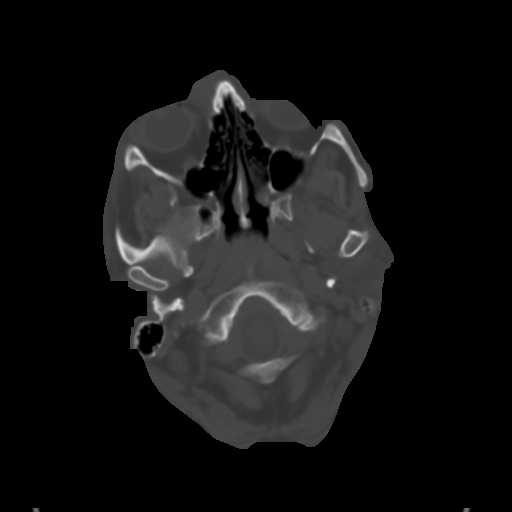
[im 4/36  brain]
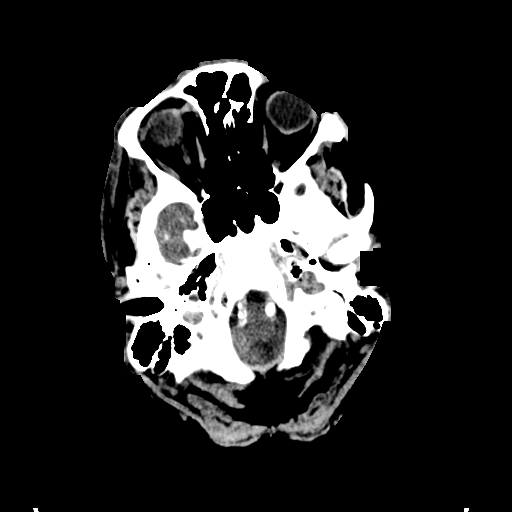
[im 7/36  brain]
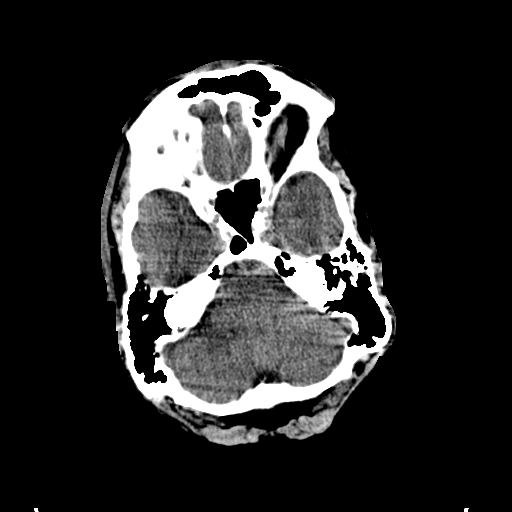
[im 9/36  brain]
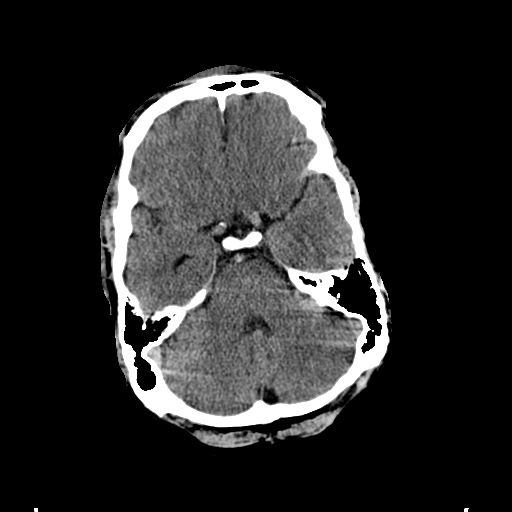
[im 11/36  brain]
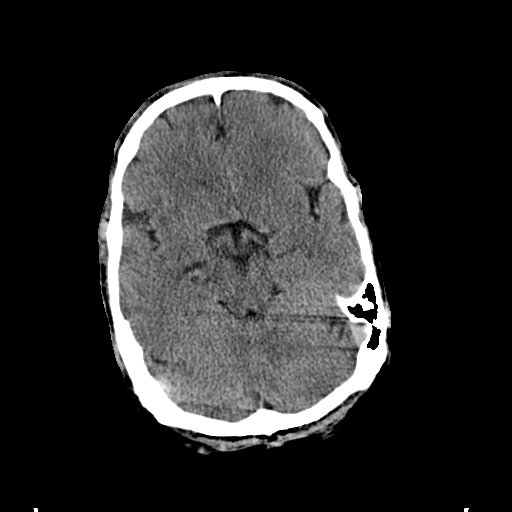
[im 11/36  bone]
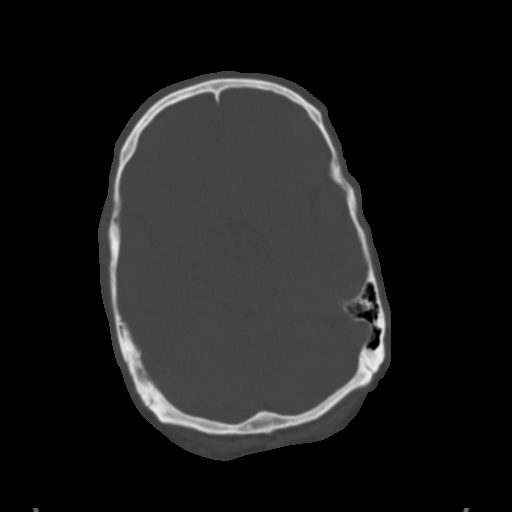
[im 14/36  brain]
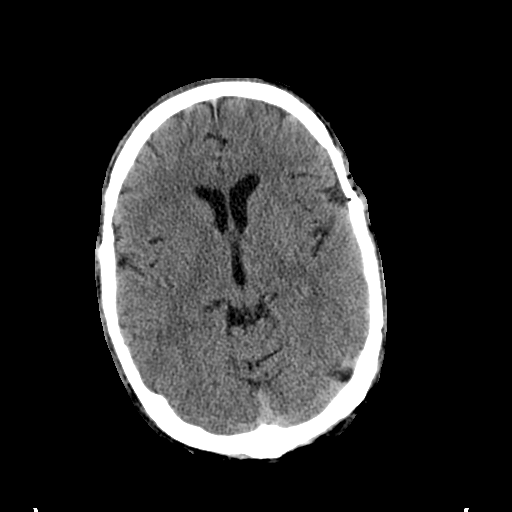
[im 16/36  brain]
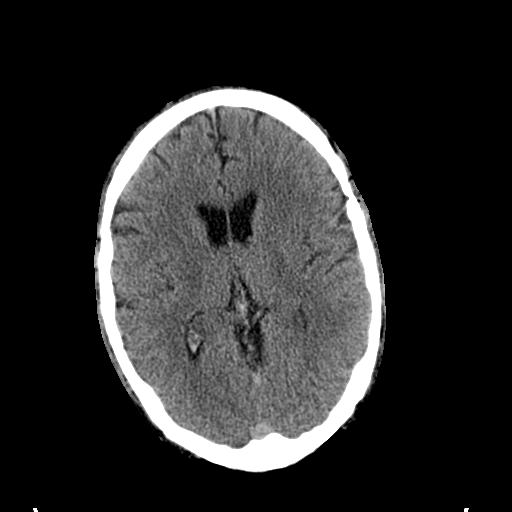
[im 19/36  brain]
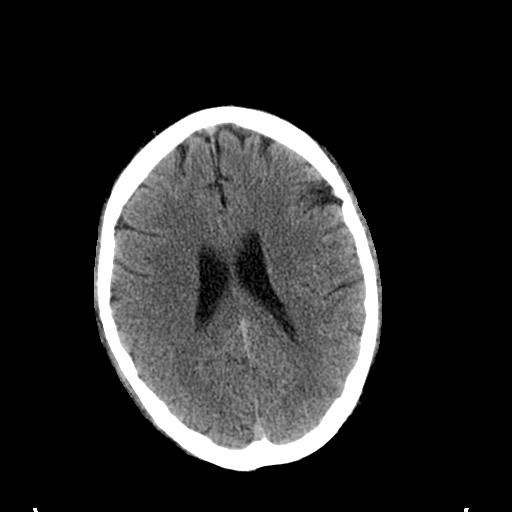
[im 20/36  brain]
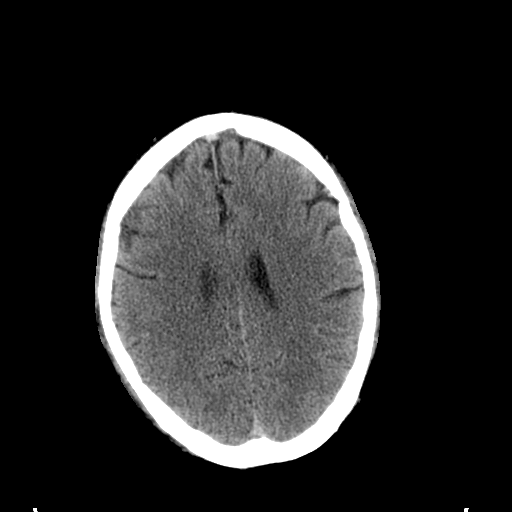
[im 20/36  bone]
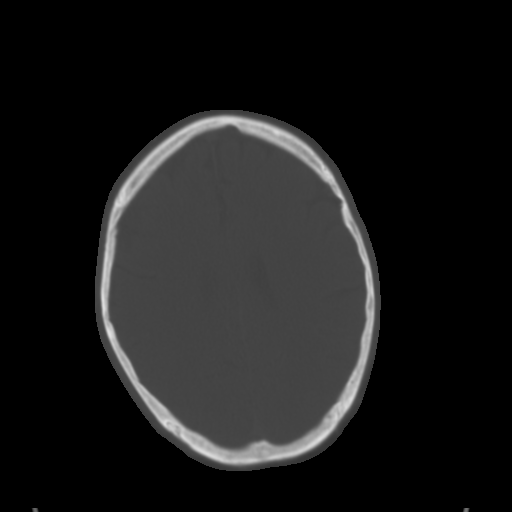
[im 22/36  brain]
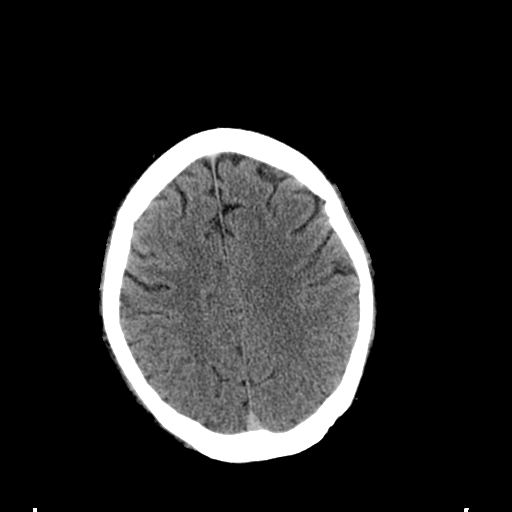
[im 25/36  brain]
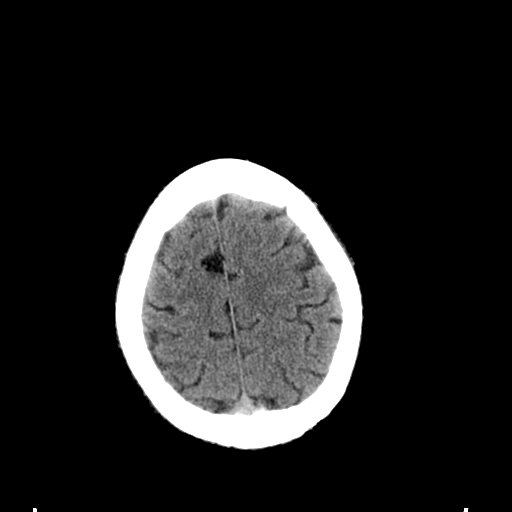
[im 27/36  brain]
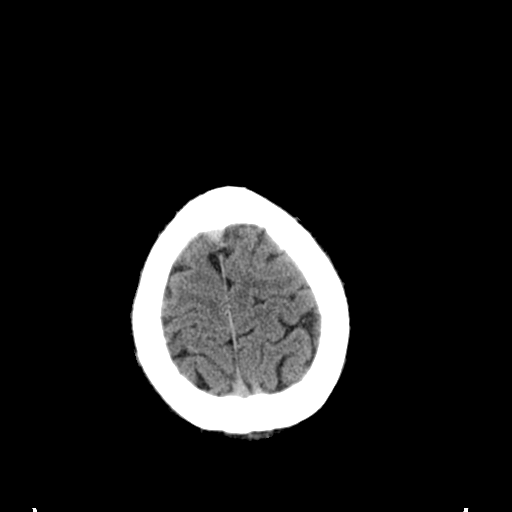
[im 29/36  brain]
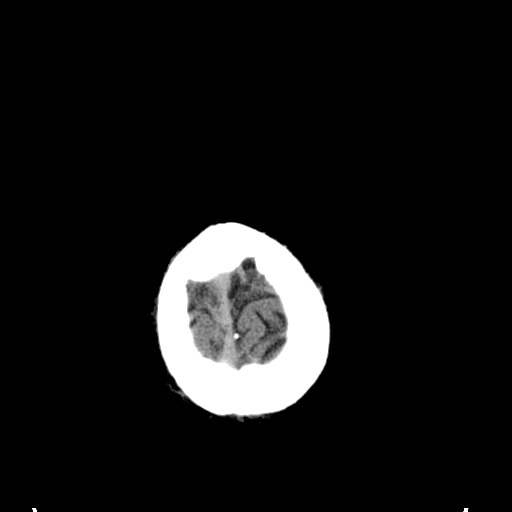
[im 29/36  bone]
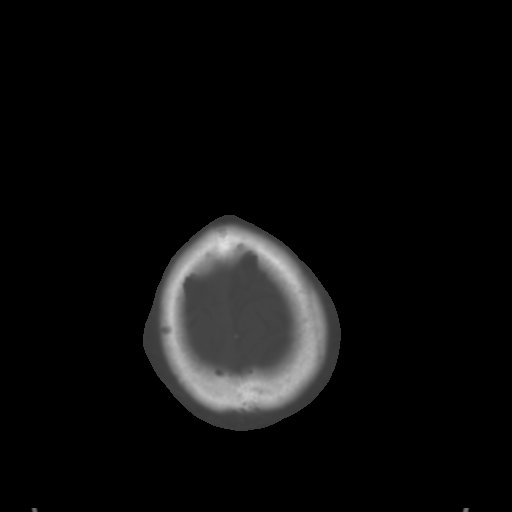
[im 32/36  brain]
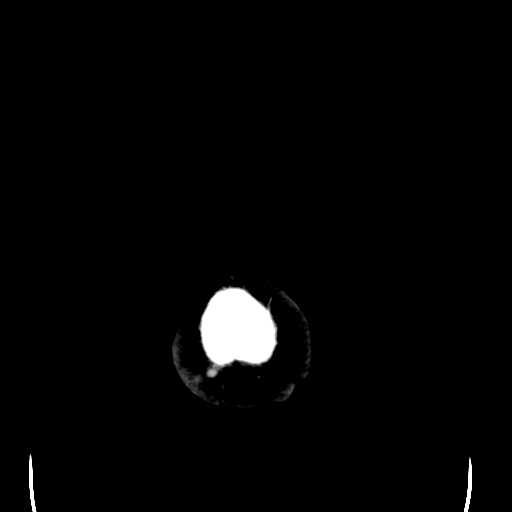
[im 34/36  brain]
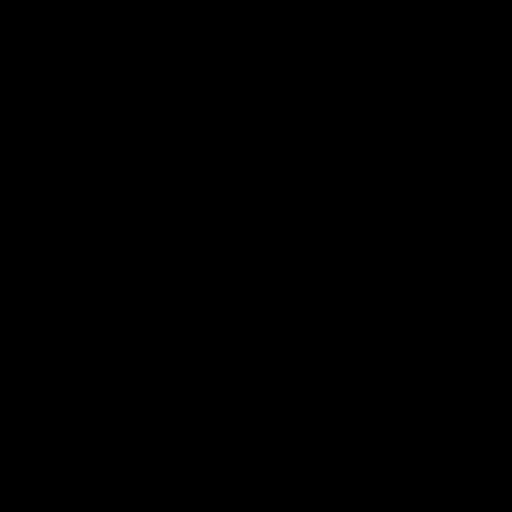

[15 of 30 positions shown; findings below may reference images not displayed]

FINDINGS: There is no evidence of acute infarction, mass lesion, or intra- or
extra-axial hemorrhage on CT.

The posterior fossa, including the cerebellum, brainstem and fourth
ventricle, is within normal limits. The third and lateral
ventricles, and basal ganglia are unremarkable in appearance. The
cerebral hemispheres are symmetric in appearance, with normal
gray-white differentiation. No mass effect or midline shift is seen.

There is no evidence of fracture; visualized osseous structures are
unremarkable in appearance. The visualized portions of the orbits
are within normal limits. The paranasal sinuses and mastoid air
cells are well-aerated. There is unusual soft tissue swelling noted
overlying the right lateral orbit. Mild soft tissue swelling tracks
overlying the bridge of the nose and frontal calvarium.
IMPRESSION: 1. No acute intracranial pathology seen on CT.
2. Soft tissue swelling overlying the right lateral orbit. Mild soft
tissue swelling tracks overlying the bridge of the nose and frontal
calvarium.
